# Patient Record
Sex: Female | Born: 1940 | Race: White | Hispanic: No | Marital: Married | Smoking: Never smoker
Health system: Southern US, Community
[De-identification: ages and names within clinical notes are randomized; demographics above are authoritative.]

## PROBLEM LIST (undated history)

## (undated) DIAGNOSIS — G4700 Insomnia, unspecified: Secondary | ICD-10-CM

## (undated) DIAGNOSIS — I10 Essential (primary) hypertension: Secondary | ICD-10-CM

## (undated) DIAGNOSIS — G9340 Encephalopathy, unspecified: Secondary | ICD-10-CM

## (undated) DIAGNOSIS — R339 Retention of urine, unspecified: Secondary | ICD-10-CM

## (undated) HISTORY — PX: ANKLE SURGERY: SHX546

## (undated) HISTORY — PX: CHOLECYSTECTOMY: SHX55

---

## 2001-05-25 ENCOUNTER — Encounter: Payer: Self-pay | Admitting: Emergency Medicine

## 2001-05-25 ENCOUNTER — Emergency Department (HOSPITAL_COMMUNITY): Admission: EM | Admit: 2001-05-25 | Discharge: 2001-05-25 | Payer: Self-pay | Admitting: Emergency Medicine

## 2003-10-19 ENCOUNTER — Encounter: Admission: RE | Admit: 2003-10-19 | Discharge: 2003-10-19 | Payer: Self-pay | Admitting: Specialist

## 2005-07-10 ENCOUNTER — Encounter: Admission: RE | Admit: 2005-07-10 | Discharge: 2005-10-08 | Payer: Self-pay | Admitting: Specialist

## 2014-02-10 ENCOUNTER — Emergency Department (HOSPITAL_COMMUNITY): Payer: Medicare Other

## 2014-02-10 ENCOUNTER — Inpatient Hospital Stay (HOSPITAL_COMMUNITY)
Admission: EM | Admit: 2014-02-10 | Discharge: 2014-02-16 | DRG: 981 | Disposition: A | Discharge status: Skilled Nursing Facility | Payer: Medicare Other | Attending: Internal Medicine | Admitting: Internal Medicine

## 2014-02-10 ENCOUNTER — Encounter (HOSPITAL_COMMUNITY): Payer: Self-pay | Admitting: Emergency Medicine

## 2014-02-10 DIAGNOSIS — I6783 Posterior reversible encephalopathy syndrome: Secondary | ICD-10-CM | POA: Diagnosis present

## 2014-02-10 DIAGNOSIS — N390 Urinary tract infection, site not specified: Secondary | ICD-10-CM | POA: Diagnosis not present

## 2014-02-10 DIAGNOSIS — D72829 Elevated white blood cell count, unspecified: Secondary | ICD-10-CM

## 2014-02-10 DIAGNOSIS — M79605 Pain in left leg: Secondary | ICD-10-CM

## 2014-02-10 DIAGNOSIS — S72002A Fracture of unspecified part of neck of left femur, initial encounter for closed fracture: Secondary | ICD-10-CM | POA: Diagnosis present

## 2014-02-10 DIAGNOSIS — E876 Hypokalemia: Secondary | ICD-10-CM | POA: Diagnosis present

## 2014-02-10 DIAGNOSIS — Y92009 Unspecified place in unspecified non-institutional (private) residence as the place of occurrence of the external cause: Secondary | ICD-10-CM

## 2014-02-10 DIAGNOSIS — G9340 Encephalopathy, unspecified: Secondary | ICD-10-CM

## 2014-02-10 DIAGNOSIS — Z9049 Acquired absence of other specified parts of digestive tract: Secondary | ICD-10-CM | POA: Diagnosis present

## 2014-02-10 DIAGNOSIS — W19XXXA Unspecified fall, initial encounter: Secondary | ICD-10-CM | POA: Diagnosis present

## 2014-02-10 DIAGNOSIS — I10 Essential (primary) hypertension: Secondary | ICD-10-CM | POA: Diagnosis present

## 2014-02-10 DIAGNOSIS — M25552 Pain in left hip: Secondary | ICD-10-CM

## 2014-02-10 DIAGNOSIS — Z886 Allergy status to analgesic agent status: Secondary | ICD-10-CM

## 2014-02-10 DIAGNOSIS — M545 Low back pain, unspecified: Secondary | ICD-10-CM

## 2014-02-10 DIAGNOSIS — R4182 Altered mental status, unspecified: Secondary | ICD-10-CM | POA: Diagnosis not present

## 2014-02-10 DIAGNOSIS — Z885 Allergy status to narcotic agent status: Secondary | ICD-10-CM

## 2014-02-10 DIAGNOSIS — E860 Dehydration: Secondary | ICD-10-CM

## 2014-02-10 DIAGNOSIS — Z96642 Presence of left artificial hip joint: Secondary | ICD-10-CM

## 2014-02-10 DIAGNOSIS — E871 Hypo-osmolality and hyponatremia: Secondary | ICD-10-CM | POA: Diagnosis present

## 2014-02-10 DIAGNOSIS — S72012A Unspecified intracapsular fracture of left femur, initial encounter for closed fracture: Secondary | ICD-10-CM | POA: Diagnosis present

## 2014-02-10 LAB — CBC WITH DIFFERENTIAL/PLATELET
Basophils Absolute: 0 10*3/uL (ref 0.0–0.1)
Basophils Relative: 0 % (ref 0–1)
EOS ABS: 0 10*3/uL (ref 0.0–0.7)
EOS PCT: 0 % (ref 0–5)
HCT: 40.5 % (ref 36.0–46.0)
Hemoglobin: 13.4 g/dL (ref 12.0–15.0)
Lymphocytes Relative: 5 % — ABNORMAL LOW (ref 12–46)
Lymphs Abs: 0.8 10*3/uL (ref 0.7–4.0)
MCH: 30.9 pg (ref 26.0–34.0)
MCHC: 33.1 g/dL (ref 30.0–36.0)
MCV: 93.5 fL (ref 78.0–100.0)
MONOS PCT: 5 % (ref 3–12)
Monocytes Absolute: 0.7 10*3/uL (ref 0.1–1.0)
NEUTROS PCT: 90 % — AB (ref 43–77)
Neutro Abs: 13.6 10*3/uL — ABNORMAL HIGH (ref 1.7–7.7)
PLATELETS: 280 10*3/uL (ref 150–400)
RBC: 4.33 MIL/uL (ref 3.87–5.11)
RDW: 12.7 % (ref 11.5–15.5)
WBC: 15.1 10*3/uL — ABNORMAL HIGH (ref 4.0–10.5)

## 2014-02-10 LAB — COMPREHENSIVE METABOLIC PANEL
ALK PHOS: 84 U/L (ref 39–117)
ALT: 16 U/L (ref 0–35)
AST: 24 U/L (ref 0–37)
Albumin: 3.9 g/dL (ref 3.5–5.2)
Anion gap: 9 (ref 5–15)
BILIRUBIN TOTAL: 1.2 mg/dL (ref 0.3–1.2)
BUN: 22 mg/dL (ref 6–23)
CHLORIDE: 101 meq/L (ref 96–112)
CO2: 24 mmol/L (ref 19–32)
Calcium: 9.7 mg/dL (ref 8.4–10.5)
Creatinine, Ser: 0.83 mg/dL (ref 0.50–1.10)
GFR, EST AFRICAN AMERICAN: 79 mL/min — AB (ref >90)
GFR, EST NON AFRICAN AMERICAN: 68 mL/min — AB (ref >90)
GLUCOSE: 153 mg/dL — AB (ref 70–99)
POTASSIUM: 3.4 mmol/L — AB (ref 3.5–5.1)
Sodium: 134 mmol/L — ABNORMAL LOW (ref 135–145)
Total Protein: 6.7 g/dL (ref 6.0–8.3)

## 2014-02-10 LAB — URINALYSIS, ROUTINE W REFLEX MICROSCOPIC
Glucose, UA: NEGATIVE mg/dL
Ketones, ur: >80 mg/dL — AB
Nitrite: NEGATIVE
Protein, ur: 100 mg/dL — AB
Specific Gravity, Urine: 1.026 (ref 1.005–1.030)
Urobilinogen, UA: 1 mg/dL (ref 0.0–1.0)
pH: 6 (ref 5.0–8.0)

## 2014-02-10 LAB — URINE MICROSCOPIC-ADD ON

## 2014-02-10 LAB — TSH: TSH: 2.2 u[IU]/mL (ref 0.350–4.500)

## 2014-02-10 LAB — AMMONIA: Ammonia: 18 umol/L (ref 11–32)

## 2014-02-10 LAB — CK: CK TOTAL: 240 U/L — AB (ref 7–177)

## 2014-02-10 MED ORDER — DEXTROSE 5 % IV SOLN
1.0000 g | Freq: Once | INTRAVENOUS | Status: AC
  Administered 2014-02-10: 1 g via INTRAVENOUS
  Filled 2014-02-10: qty 10
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
## 2014-02-10 NOTE — ED Notes (Signed)
Patient transported back to radiology
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
## 2014-02-11 ENCOUNTER — Inpatient Hospital Stay (HOSPITAL_COMMUNITY): Payer: Medicare Other

## 2014-02-11 ENCOUNTER — Encounter (HOSPITAL_COMMUNITY)
Admission: EM | Disposition: A | Discharge status: Skilled Nursing Facility | Payer: Self-pay | Source: Home / Self Care | Attending: Internal Medicine

## 2014-02-11 ENCOUNTER — Encounter (HOSPITAL_COMMUNITY): Payer: Self-pay | Admitting: Certified Registered Nurse Anesthetist

## 2014-02-11 ENCOUNTER — Inpatient Hospital Stay (HOSPITAL_COMMUNITY): Payer: Medicare Other | Admitting: Certified Registered Nurse Anesthetist

## 2014-02-11 DIAGNOSIS — E871 Hypo-osmolality and hyponatremia: Secondary | ICD-10-CM | POA: Diagnosis present

## 2014-02-11 DIAGNOSIS — R609 Edema, unspecified: Secondary | ICD-10-CM

## 2014-02-11 DIAGNOSIS — G9340 Encephalopathy, unspecified: Secondary | ICD-10-CM

## 2014-02-11 DIAGNOSIS — M25552 Pain in left hip: Secondary | ICD-10-CM

## 2014-02-11 DIAGNOSIS — R4182 Altered mental status, unspecified: Secondary | ICD-10-CM | POA: Diagnosis present

## 2014-02-11 DIAGNOSIS — I6783 Posterior reversible encephalopathy syndrome: Secondary | ICD-10-CM | POA: Diagnosis present

## 2014-02-11 DIAGNOSIS — Z886 Allergy status to analgesic agent status: Secondary | ICD-10-CM | POA: Diagnosis not present

## 2014-02-11 DIAGNOSIS — S72012A Unspecified intracapsular fracture of left femur, initial encounter for closed fracture: Secondary | ICD-10-CM | POA: Diagnosis present

## 2014-02-11 DIAGNOSIS — I10 Essential (primary) hypertension: Secondary | ICD-10-CM | POA: Diagnosis not present

## 2014-02-11 DIAGNOSIS — D72829 Elevated white blood cell count, unspecified: Secondary | ICD-10-CM

## 2014-02-11 DIAGNOSIS — Z9049 Acquired absence of other specified parts of digestive tract: Secondary | ICD-10-CM | POA: Diagnosis not present

## 2014-02-11 DIAGNOSIS — Y92009 Unspecified place in unspecified non-institutional (private) residence as the place of occurrence of the external cause: Secondary | ICD-10-CM | POA: Diagnosis not present

## 2014-02-11 DIAGNOSIS — N390 Urinary tract infection, site not specified: Secondary | ICD-10-CM | POA: Diagnosis present

## 2014-02-11 DIAGNOSIS — E876 Hypokalemia: Secondary | ICD-10-CM | POA: Diagnosis not present

## 2014-02-11 DIAGNOSIS — Z885 Allergy status to narcotic agent status: Secondary | ICD-10-CM | POA: Diagnosis not present

## 2014-02-11 DIAGNOSIS — E860 Dehydration: Secondary | ICD-10-CM | POA: Diagnosis not present

## 2014-02-11 DIAGNOSIS — W19XXXA Unspecified fall, initial encounter: Secondary | ICD-10-CM | POA: Diagnosis not present

## 2014-02-11 HISTORY — PX: HIP ARTHROPLASTY: SHX981

## 2014-02-11 LAB — RAPID URINE DRUG SCREEN, HOSP PERFORMED
AMPHETAMINES: NOT DETECTED
BARBITURATES: NOT DETECTED
BENZODIAZEPINES: NOT DETECTED
Cocaine: NOT DETECTED
OPIATES: NOT DETECTED
TETRAHYDROCANNABINOL: NOT DETECTED

## 2014-02-11 LAB — CBC WITH DIFFERENTIAL/PLATELET
BASOS PCT: 0 % (ref 0–1)
Basophils Absolute: 0 10*3/uL (ref 0.0–0.1)
EOS ABS: 0 10*3/uL (ref 0.0–0.7)
Eosinophils Relative: 0 % (ref 0–5)
HEMATOCRIT: 34.2 % — AB (ref 36.0–46.0)
Hemoglobin: 11.4 g/dL — ABNORMAL LOW (ref 12.0–15.0)
LYMPHS PCT: 8 % — AB (ref 12–46)
Lymphs Abs: 1.1 10*3/uL (ref 0.7–4.0)
MCH: 30.3 pg (ref 26.0–34.0)
MCHC: 33.3 g/dL (ref 30.0–36.0)
MCV: 91 fL (ref 78.0–100.0)
MONO ABS: 1.1 10*3/uL — AB (ref 0.1–1.0)
Monocytes Relative: 7 % (ref 3–12)
Neutro Abs: 12.4 10*3/uL — ABNORMAL HIGH (ref 1.7–7.7)
Neutrophils Relative %: 85 % — ABNORMAL HIGH (ref 43–77)
Platelets: 292 10*3/uL (ref 150–400)
RBC: 3.76 MIL/uL — ABNORMAL LOW (ref 3.87–5.11)
RDW: 12.8 % (ref 11.5–15.5)
WBC: 14.6 10*3/uL — AB (ref 4.0–10.5)

## 2014-02-11 LAB — CBC
HEMATOCRIT: 34.2 % — AB (ref 36.0–46.0)
HEMOGLOBIN: 11.3 g/dL — AB (ref 12.0–15.0)
MCH: 29.9 pg (ref 26.0–34.0)
MCHC: 33 g/dL (ref 30.0–36.0)
MCV: 90.5 fL (ref 78.0–100.0)
Platelets: 299 10*3/uL (ref 150–400)
RBC: 3.78 MIL/uL — AB (ref 3.87–5.11)
RDW: 12.8 % (ref 11.5–15.5)
WBC: 14.8 10*3/uL — AB (ref 4.0–10.5)

## 2014-02-11 LAB — COMPREHENSIVE METABOLIC PANEL
ALBUMIN: 3.6 g/dL (ref 3.5–5.2)
ALK PHOS: 79 U/L (ref 39–117)
ALT: 14 U/L (ref 0–35)
AST: 21 U/L (ref 0–37)
Anion gap: 14 (ref 5–15)
BILIRUBIN TOTAL: 1 mg/dL (ref 0.3–1.2)
BUN: 20 mg/dL (ref 6–23)
CHLORIDE: 100 meq/L (ref 96–112)
CO2: 18 mmol/L — ABNORMAL LOW (ref 19–32)
Calcium: 9.2 mg/dL (ref 8.4–10.5)
Creatinine, Ser: 0.69 mg/dL (ref 0.50–1.10)
GFR calc Af Amer: >90 mL/min (ref >90)
GFR, EST NON AFRICAN AMERICAN: 84 mL/min — AB (ref >90)
Glucose, Bld: 189 mg/dL — ABNORMAL HIGH (ref 70–99)
POTASSIUM: 3.2 mmol/L — AB (ref 3.5–5.1)
Sodium: 132 mmol/L — ABNORMAL LOW (ref 135–145)
Total Protein: 6.2 g/dL (ref 6.0–8.3)

## 2014-02-11 LAB — CREATININE, SERUM
Creatinine, Ser: 0.68 mg/dL (ref 0.50–1.10)
GFR, EST NON AFRICAN AMERICAN: 85 mL/min — AB (ref >90)

## 2014-02-11 LAB — SURGICAL PCR SCREEN
MRSA, PCR: NEGATIVE
STAPHYLOCOCCUS AUREUS: POSITIVE — AB

## 2014-02-11 LAB — MAGNESIUM: Magnesium: 1.8 mg/dL (ref 1.5–2.5)

## 2014-02-11 LAB — MRSA PCR SCREENING: MRSA by PCR: NEGATIVE

## 2014-02-11 SURGERY — HEMIARTHROPLASTY, HIP, DIRECT ANTERIOR APPROACH, FOR FRACTURE
Anesthesia: Monitor Anesthesia Care | Site: Hip | Laterality: Left

## 2014-02-11 MED ORDER — CEFAZOLIN SODIUM-DEXTROSE 2-3 GM-% IV SOLR
INTRAVENOUS | Status: DC | PRN
  Administered 2014-02-11: 2 g via INTRAVENOUS

## 2014-02-11 MED ORDER — DOCUSATE SODIUM 100 MG PO CAPS
100.0000 mg | ORAL_CAPSULE | Freq: Two times a day (BID) | ORAL | Status: DC
  Administered 2014-02-11 – 2014-02-16 (×8): 100 mg via ORAL
  Filled 2014-02-11 (×11): qty 1

## 2014-02-11 MED ORDER — SODIUM CHLORIDE 0.9 % IJ SOLN
INTRAMUSCULAR | Status: AC
  Filled 2014-02-11: qty 10

## 2014-02-11 MED ORDER — INFLUENZA VAC SPLIT QUAD 0.5 ML IM SUSY
0.5000 mL | PREFILLED_SYRINGE | INTRAMUSCULAR | Status: AC
  Administered 2014-02-12: 0.5 mL via INTRAMUSCULAR
  Filled 2014-02-11: qty 0.5

## 2014-02-11 MED ORDER — ONDANSETRON HCL 4 MG/2ML IJ SOLN: 4.0000 mg | Freq: Once | INTRAMUSCULAR | Status: DC | PRN

## 2014-02-11 MED ORDER — PROPOFOL 10 MG/ML IV EMUL
INTRAVENOUS | Status: AC
  Filled 2014-02-11: qty 300

## 2014-02-11 MED ORDER — LIDOCAINE HCL (CARDIAC) 20 MG/ML IV SOLN
INTRAVENOUS | Status: AC
  Filled 2014-02-11: qty 5

## 2014-02-11 MED ORDER — SODIUM CHLORIDE 0.9 % IV SOLN
INTRAVENOUS | Status: DC
  Administered 2014-02-11: 21:00:00 via INTRAVENOUS
  Filled 2014-02-11 (×8): qty 1000

## 2014-02-11 MED ORDER — MIDAZOLAM HCL 2 MG/2ML IJ SOLN
INTRAMUSCULAR | Status: AC
  Filled 2014-02-11: qty 2

## 2014-02-11 MED ORDER — LABETALOL HCL 5 MG/ML IV SOLN
10.0000 mg | INTRAVENOUS | Status: DC | PRN
  Administered 2014-02-11 (×2): 10 mg via INTRAVENOUS
  Filled 2014-02-11 (×2): qty 4

## 2014-02-11 MED ORDER — CEFAZOLIN SODIUM 1-5 GM-% IV SOLN
1.0000 g | Freq: Four times a day (QID) | INTRAVENOUS | Status: DC
Start: 2014-02-11 — End: 2014-02-11

## 2014-02-11 MED ORDER — EPHEDRINE SULFATE 50 MG/ML IJ SOLN
INTRAMUSCULAR | Status: AC
  Filled 2014-02-11: qty 1

## 2014-02-11 MED ORDER — FENTANYL CITRATE 0.05 MG/ML IJ SOLN: 50.0000 ug | INTRAMUSCULAR | Status: DC | PRN

## 2014-02-11 MED ORDER — ALUM & MAG HYDROXIDE-SIMETH 200-200-20 MG/5ML PO SUSP: 30.0000 mL | ORAL | Status: DC | PRN

## 2014-02-11 MED ORDER — ARTIFICIAL TEARS OP OINT
TOPICAL_OINTMENT | OPHTHALMIC | Status: AC
  Filled 2014-02-11: qty 3.5

## 2014-02-11 MED ORDER — METOCLOPRAMIDE HCL 5 MG/ML IJ SOLN
5.0000 mg | Freq: Three times a day (TID) | INTRAMUSCULAR | Status: DC | PRN
  Filled 2014-02-11: qty 2

## 2014-02-11 MED ORDER — HYDRALAZINE HCL 20 MG/ML IJ SOLN: 10.0000 mg | Freq: Four times a day (QID) | INTRAMUSCULAR | Status: DC | PRN

## 2014-02-11 MED ORDER — ASPIRIN EC 325 MG PO TBEC
325.0000 mg | DELAYED_RELEASE_TABLET | Freq: Two times a day (BID) | ORAL | Status: DC
  Administered 2014-02-11 – 2014-02-16 (×10): 325 mg via ORAL
  Filled 2014-02-11 (×11): qty 1

## 2014-02-11 MED ORDER — LABETALOL HCL 5 MG/ML IV SOLN
5.0000 mg | INTRAVENOUS | Status: DC | PRN
  Administered 2014-02-11: 5 mg via INTRAVENOUS
  Filled 2014-02-11: qty 4

## 2014-02-11 MED ORDER — SODIUM CHLORIDE 0.9 % IR SOLN
Status: DC | PRN
  Administered 2014-02-11: 1000 mL

## 2014-02-11 MED ORDER — HYDROCODONE-ACETAMINOPHEN 5-325 MG PO TABS: 1.0000 | ORAL_TABLET | Freq: Four times a day (QID) | ORAL | Status: DC | PRN

## 2014-02-11 MED ORDER — PNEUMOCOCCAL VAC POLYVALENT 25 MCG/0.5ML IJ INJ
0.5000 mL | INJECTION | INTRAMUSCULAR | Status: AC
  Administered 2014-02-12: 0.5 mL via INTRAMUSCULAR
  Filled 2014-02-11: qty 0.5

## 2014-02-11 MED ORDER — POLYETHYLENE GLYCOL 3350 17 G PO PACK
17.0000 g | PACK | Freq: Every day | ORAL | Status: DC | PRN
  Administered 2014-02-15 – 2014-02-16 (×2): 17 g via ORAL
  Filled 2014-02-11 (×4): qty 1

## 2014-02-11 MED ORDER — ONDANSETRON HCL 4 MG/2ML IJ SOLN
INTRAMUSCULAR | Status: AC
  Filled 2014-02-11: qty 2

## 2014-02-11 MED ORDER — MIDAZOLAM HCL 2 MG/2ML IJ SOLN: 1.0000 mg | INTRAMUSCULAR | Status: DC | PRN

## 2014-02-11 MED ORDER — POTASSIUM CHLORIDE CRYS ER 20 MEQ PO TBCR
40.0000 meq | EXTENDED_RELEASE_TABLET | Freq: Once | ORAL | Status: AC
  Administered 2014-02-11: 40 meq via ORAL
  Filled 2014-02-11: qty 2

## 2014-02-11 MED ORDER — SUCCINYLCHOLINE CHLORIDE 20 MG/ML IJ SOLN
INTRAMUSCULAR | Status: AC
  Filled 2014-02-11: qty 1

## 2014-02-11 MED ORDER — FENTANYL CITRATE 0.05 MG/ML IJ SOLN: 25.0000 ug | INTRAMUSCULAR | Status: DC | PRN

## 2014-02-11 MED ORDER — PROPOFOL INFUSION 10 MG/ML OPTIME
INTRAVENOUS | Status: DC | PRN
  Administered 2014-02-11: 100 ug/kg/min via INTRAVENOUS

## 2014-02-11 MED ORDER — METHOCARBAMOL 1000 MG/10ML IJ SOLN
500.0000 mg | Freq: Four times a day (QID) | INTRAVENOUS | Status: DC | PRN
  Filled 2014-02-11: qty 5

## 2014-02-11 MED ORDER — CARVEDILOL 6.25 MG PO TABS
6.2500 mg | ORAL_TABLET | Freq: Two times a day (BID) | ORAL | Status: DC
  Administered 2014-02-11 – 2014-02-14 (×6): 6.25 mg via ORAL
  Filled 2014-02-11 (×9): qty 1

## 2014-02-11 MED ORDER — AMLODIPINE BESYLATE 10 MG PO TABS
10.0000 mg | ORAL_TABLET | Freq: Every day | ORAL | Status: DC
  Administered 2014-02-12 – 2014-02-16 (×5): 10 mg via ORAL
  Filled 2014-02-11 (×5): qty 1

## 2014-02-11 MED ORDER — ROCURONIUM BROMIDE 50 MG/5ML IV SOLN
INTRAVENOUS | Status: AC
  Filled 2014-02-11: qty 1

## 2014-02-11 MED ORDER — MORPHINE SULFATE 2 MG/ML IJ SOLN
0.5000 mg | INTRAMUSCULAR | Status: DC | PRN
  Filled 2014-02-11: qty 1

## 2014-02-11 MED ORDER — FENTANYL CITRATE 0.05 MG/ML IJ SOLN
INTRAMUSCULAR | Status: AC
  Filled 2014-02-11: qty 5

## 2014-02-11 MED ORDER — POTASSIUM CHLORIDE IN NACL 20-0.9 MEQ/L-% IV SOLN
INTRAVENOUS | Status: DC
  Administered 2014-02-11: 04:00:00 via INTRAVENOUS
  Filled 2014-02-11 (×2): qty 1000

## 2014-02-11 MED ORDER — BUPIVACAINE-EPINEPHRINE (PF) 0.5% -1:200000 IJ SOLN
INTRAMUSCULAR | Status: DC | PRN
  Administered 2014-02-11: 30 mL via PERINEURAL

## 2014-02-11 MED ORDER — HEPARIN SODIUM (PORCINE) 5000 UNIT/ML IJ SOLN: 5000.0000 [IU] | Freq: Three times a day (TID) | INTRAMUSCULAR | Status: DC

## 2014-02-11 MED ORDER — PHENOL 1.4 % MT LIQD: 1.0000 | OROMUCOSAL | Status: DC | PRN

## 2014-02-11 MED ORDER — PHENYLEPHRINE HCL 10 MG/ML IJ SOLN
10.0000 mg | INTRAVENOUS | Status: DC | PRN
  Administered 2014-02-11: 80 ug/min via INTRAVENOUS

## 2014-02-11 MED ORDER — AMLODIPINE BESYLATE 2.5 MG PO TABS
2.5000 mg | ORAL_TABLET | Freq: Every day | ORAL | Status: DC
  Administered 2014-02-11: 2.5 mg via ORAL
  Filled 2014-02-11: qty 1

## 2014-02-11 MED ORDER — KCL IN DEXTROSE-NACL 40-5-0.9 MEQ/L-%-% IV SOLN
INTRAVENOUS | Status: DC
  Administered 2014-02-11: 08:00:00 via INTRAVENOUS
  Filled 2014-02-11 (×2): qty 1000

## 2014-02-11 MED ORDER — LACTATED RINGERS IV SOLN
INTRAVENOUS | Status: DC | PRN
  Administered 2014-02-11 (×2): via INTRAVENOUS

## 2014-02-11 MED ORDER — ACETAMINOPHEN 325 MG PO TABS
650.0000 mg | ORAL_TABLET | Freq: Four times a day (QID) | ORAL | Status: DC | PRN
  Administered 2014-02-11 – 2014-02-13 (×2): 650 mg via ORAL
  Filled 2014-02-11 (×2): qty 2

## 2014-02-11 MED ORDER — ONDANSETRON HCL 4 MG PO TABS: 4.0000 mg | ORAL_TABLET | Freq: Four times a day (QID) | ORAL | Status: DC | PRN

## 2014-02-11 MED ORDER — LABETALOL HCL 5 MG/ML IV SOLN
10.0000 mg | Freq: Once | INTRAVENOUS | Status: AC
  Administered 2014-02-11: 10 mg via INTRAVENOUS
  Filled 2014-02-11: qty 4

## 2014-02-11 MED ORDER — PHENYLEPHRINE HCL 10 MG/ML IJ SOLN
INTRAMUSCULAR | Status: DC | PRN
  Administered 2014-02-11: 40 ug via INTRAVENOUS

## 2014-02-11 MED ORDER — ASPIRIN EC 325 MG PO TBEC
325.0000 mg | DELAYED_RELEASE_TABLET | Freq: Two times a day (BID) | ORAL | Status: DC
Start: 2014-02-11 — End: 2017-09-26

## 2014-02-11 MED ORDER — METHOCARBAMOL 500 MG PO TABS
500.0000 mg | ORAL_TABLET | Freq: Four times a day (QID) | ORAL | Status: DC | PRN
  Filled 2014-02-11: qty 1

## 2014-02-11 MED ORDER — PROPOFOL 10 MG/ML IV BOLUS
INTRAVENOUS | Status: AC
  Filled 2014-02-11: qty 20

## 2014-02-11 MED ORDER — PROPOFOL 10 MG/ML IV BOLUS
INTRAVENOUS | Status: DC | PRN
  Administered 2014-02-11: 40 mg via INTRAVENOUS

## 2014-02-11 MED ORDER — AMLODIPINE BESYLATE 5 MG PO TABS
7.5000 mg | ORAL_TABLET | Freq: Once | ORAL | Status: AC
  Administered 2014-02-11: 7.5 mg via ORAL
  Filled 2014-02-11: qty 1

## 2014-02-11 MED ORDER — MENTHOL 3 MG MT LOZG: 1.0000 | LOZENGE | OROMUCOSAL | Status: DC | PRN

## 2014-02-11 MED ORDER — LACTATED RINGERS IV SOLN
INTRAVENOUS | Status: DC
  Administered 2014-02-11: 15:00:00 via INTRAVENOUS

## 2014-02-11 MED ORDER — FENTANYL CITRATE 0.05 MG/ML IJ SOLN
INTRAMUSCULAR | Status: DC | PRN
  Administered 2014-02-11 (×3): 25 ug via INTRAVENOUS
  Administered 2014-02-11: 100 ug via INTRAVENOUS

## 2014-02-11 MED ORDER — HYDROCODONE-ACETAMINOPHEN 5-325 MG PO TABS
1.0000 | ORAL_TABLET | Freq: Four times a day (QID) | ORAL | Status: DC | PRN
  Administered 2014-02-11 – 2014-02-14 (×2): 2 via ORAL
  Filled 2014-02-11 (×2): qty 2

## 2014-02-11 MED ORDER — CEFTRIAXONE SODIUM IN DEXTROSE 20 MG/ML IV SOLN
1.0000 g | INTRAVENOUS | Status: DC
  Administered 2014-02-11 – 2014-02-14 (×4): 1 g via INTRAVENOUS
  Filled 2014-02-11 (×5): qty 50

## 2014-02-11 MED ORDER — METOCLOPRAMIDE HCL 5 MG PO TABS
5.0000 mg | ORAL_TABLET | Freq: Three times a day (TID) | ORAL | Status: DC | PRN
  Filled 2014-02-11: qty 2

## 2014-02-11 MED ORDER — ONDANSETRON HCL 4 MG/2ML IJ SOLN
4.0000 mg | Freq: Four times a day (QID) | INTRAMUSCULAR | Status: DC | PRN
  Administered 2014-02-16: 4 mg via INTRAVENOUS
  Filled 2014-02-11: qty 2

## 2014-02-11 MED ORDER — ACETAMINOPHEN 650 MG RE SUPP: 650.0000 mg | Freq: Four times a day (QID) | RECTAL | Status: DC | PRN

## 2014-02-11 SURGICAL SUPPLY — 53 items
ADH SKN CLS APL DERMABOND .7 (GAUZE/BANDAGES/DRESSINGS) ×1
BLADE SAW SGTL 18X1.27X75 (BLADE) ×2 IMPLANT
BLADE SAW SGTL 18X1.27X75MM (BLADE) ×1
CAPT HIP HEMI 2 ×2 IMPLANT
COVER SURGICAL LIGHT HANDLE (MISCELLANEOUS) ×3 IMPLANT
DERMABOND ADVANCED (GAUZE/BANDAGES/DRESSINGS) ×2
DERMABOND ADVANCED .7 DNX12 (GAUZE/BANDAGES/DRESSINGS) ×1 IMPLANT
DRAPE IMP U-DRAPE 54X76 (DRAPES) ×3 IMPLANT
DRAPE INCISE IOBAN 85X60 (DRAPES) ×3 IMPLANT
DRAPE ORTHO SPLIT 77X108 STRL (DRAPES) ×6
DRAPE SURG ORHT 6 SPLT 77X108 (DRAPES) ×2 IMPLANT
DRAPE U-SHAPE 47X51 STRL (DRAPES) ×3 IMPLANT
DRSG AQUACEL AG ADV 3.5X10 (GAUZE/BANDAGES/DRESSINGS) ×3 IMPLANT
DURAPREP 26ML APPLICATOR (WOUND CARE) ×3 IMPLANT
ELECT BLADE 4.0 EZ CLEAN MEGAD (MISCELLANEOUS) ×3
ELECT REM PT RETURN 9FT ADLT (ELECTROSURGICAL) ×3
ELECTRODE BLDE 4.0 EZ CLN MEGD (MISCELLANEOUS) ×1 IMPLANT
ELECTRODE REM PT RTRN 9FT ADLT (ELECTROSURGICAL) ×1 IMPLANT
EVACUATOR 1/8 PVC DRAIN (DRAIN) IMPLANT
FACESHIELD WRAPAROUND (MASK) ×6 IMPLANT
FACESHIELD WRAPAROUND OR TEAM (MASK) ×2 IMPLANT
GLOVE BIOGEL PI IND STRL 7.5 (GLOVE) ×1 IMPLANT
GLOVE BIOGEL PI IND STRL 8 (GLOVE) ×2 IMPLANT
GLOVE BIOGEL PI INDICATOR 7.5 (GLOVE) ×2
GLOVE BIOGEL PI INDICATOR 8 (GLOVE) ×4
GLOVE ECLIPSE 8.0 STRL XLNG CF (GLOVE) ×3 IMPLANT
GLOVE ORTHO TXT STRL SZ7.5 (GLOVE) ×3 IMPLANT
GLOVE SURG ORTHO 8.0 STRL STRW (GLOVE) ×3 IMPLANT
GOWN STRL REIN 3XL XLG LVL4 (GOWN DISPOSABLE) ×3 IMPLANT
GOWN STRL REUS W/ TWL LRG LVL3 (GOWN DISPOSABLE) ×3 IMPLANT
GOWN STRL REUS W/TWL LRG LVL3 (GOWN DISPOSABLE) ×9
HANDPIECE INTERPULSE COAX TIP (DISPOSABLE)
IMMOBILIZER KNEE 22 UNIV (SOFTGOODS) ×3 IMPLANT
KIT BASIN OR (CUSTOM PROCEDURE TRAY) ×3 IMPLANT
KIT ROOM TURNOVER OR (KITS) ×3 IMPLANT
LIQUID BAND (GAUZE/BANDAGES/DRESSINGS) ×2 IMPLANT
MANIFOLD NEPTUNE II (INSTRUMENTS) ×3 IMPLANT
NS IRRIG 1000ML POUR BTL (IV SOLUTION) ×3 IMPLANT
PACK TOTAL JOINT (CUSTOM PROCEDURE TRAY) ×3 IMPLANT
PACK UNIVERSAL I (CUSTOM PROCEDURE TRAY) ×3 IMPLANT
PAD ARMBOARD 7.5X6 YLW CONV (MISCELLANEOUS) ×6 IMPLANT
SET HNDPC FAN SPRY TIP SCT (DISPOSABLE) IMPLANT
SPONGE LAP 4X18 X RAY DECT (DISPOSABLE) ×6 IMPLANT
SUT MNCRL AB 4-0 PS2 18 (SUTURE) ×2 IMPLANT
SUT VIC AB 1 CT1 27 (SUTURE) ×6
SUT VIC AB 1 CT1 27XBRD ANBCTR (SUTURE) ×2 IMPLANT
SUT VIC AB 2-0 CT1 27 (SUTURE) ×6
SUT VIC AB 2-0 CT1 TAPERPNT 27 (SUTURE) IMPLANT
TOWEL OR 17X24 6PK STRL BLUE (TOWEL DISPOSABLE) ×3 IMPLANT
TOWEL OR 17X26 10 PK STRL BLUE (TOWEL DISPOSABLE) ×3 IMPLANT
TRAY FOLEY CATH 14FR (SET/KITS/TRAYS/PACK) IMPLANT
V-LOC ×2 IMPLANT
WATER STERILE IRR 1000ML POUR (IV SOLUTION) ×4 IMPLANT

## 2014-02-11 NOTE — Progress Notes (Signed)
Foley placed for acute urinary retention

## 2014-02-11 NOTE — Progress Notes (Addendum)
Patient admitted earlier today for   1. 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 Monitor.   2. Left hip pain. X-ray reveals femoral fracture. Orthopedics consulted. Check baseline EKG. Denies any known heart issues, no loud murmurs. Should be low to moderate risk for adverse cardio pulmonary outcome. Coreg and Norvasc added for better blood pressure control.   3. Hypertension. Added Coreg and Norvasc. As needed IV hydralazine   4. UTI leukocytosis. On Rocephin follow cultures.   5. Low potassium. Replace.   6. Urinary retention. Over 700 mL. Place Foley.
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
## 2014-02-11 NOTE — Progress Notes (Signed)
VASCULAR LAB PRELIMINARY  PRELIMINARY  PRELIMINARY  PRELIMINARY  Bilateral lower extremity venous duplex  completed.    Preliminary report:  Bilateral:  No evidence of DVT, superficial thrombosis, or Baker's Cyst.    Jovian Lembcke, RVT 02/11/2014, 1:24 PM
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
## 2014-02-11 NOTE — Anesthesia Preprocedure Evaluation (Signed)
Anesthesia Evaluation  Patient identified by MRN, date of birth, ID band Patient awake    Reviewed: Allergy & Precautions, H&P , NPO status , Patient's Chart, lab work & pertinent test results  Airway Mallampati: I  TM Distance: >3 FB Neck ROM: Full    Dental  (+) Teeth Intact, Dental Advisory Given   Pulmonary  breath sounds clear to auscultation        Cardiovascular Rhythm:Regular Rate:Normal     Neuro/Psych    GI/Hepatic   Endo/Other    Renal/GU      Musculoskeletal   Abdominal   Peds  Hematology   Anesthesia Other Findings   Reproductive/Obstetrics                             Anesthesia Physical Anesthesia Plan  ASA: III  Anesthesia Plan: MAC and Spinal   Post-op Pain Management: MAC Combined w/ Regional for Post-op pain   Induction: Intravenous  Airway Management Planned: Simple Face Mask  Additional Equipment:   Intra-op Plan:   Post-operative Plan:   Informed Consent: I have reviewed the patients History and Physical, chart, labs and discussed the procedure including the risks, benefits and alternatives for the proposed anesthesia with the patient or authorized representative who has indicated his/her understanding and acceptance.   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 Spoke to pt daughter by phone and explained the anesthetic plan to her.  Will hope to do this without general anesthesia, using peripheral block and SAB.)        Anesthesia Quick Evaluation

## 2014-02-11 NOTE — Progress Notes (Signed)
EEG Completed; Results Pending  
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
## 2014-02-11 NOTE — Progress Notes (Signed)
0616 received results of MRI from radiologist. Txt paged Dr. Ernestina Patches with [facility] to notify of results. 0625 received call back from Dr. Ernestina Patches. Also notified MD of BP 170's-180's. Orders given for PRN 5 mg of labetalol and to administered scheduled dose of norvasc now.

## 2014-02-11 NOTE — Procedures (Signed)
ELECTROENCEPHALOGRAM REPORT  Patient: Monique Rush       Room #: 0Y17 EEG No. ID: [id number] Age: 73 y.o.      [phone]  Sex: female Referring Physician: Ronnie Derby Report Date:  02/11/2014        Interpreting Physician: Anthony Sar  History: Monique Rush is an 73 y.o. female admitted with encephalopathy and confusion with dehydration and hypertension, as well as PRES.  Indications for study:  Assess severity of encephalopathy; rule out focal seizure activity.  Technique: This is an 18 channel routine scalp EEG performed at the bedside with bipolar and monopolar montages arranged in accordance to the international 10/20 system of electrode placement.   Description: This EEG recording was performed during wakefulness. Patient was noted to be agitated and having visual hallucinations during the study. Considerable artifact secondary to muscle activity was recorded. Predominant cerebral activity consisted of 9 Hz symmetrical alpha rhythm. Photic stimulation and hyperventilation were not performed. No epileptiform discharges were recorded. There was no abnormal slowing of cerebral activity.  Interpretation: This is a normal EEG recording during wakefulness. No evidence of an epileptic disorder was demonstrated.   Rush Farmer M.D. [facility] [phone]

## 2014-02-11 NOTE — Progress Notes (Signed)
UR completed.  Dae Antonucci, RN BSN MHA CCM Trauma/Neuro ICU Case Manager [phone]  

## 2014-02-11 NOTE — Progress Notes (Signed)
Subjective: More awake.   She complains of "back" pain but it is really more left side pain.   Exam: Filed Vitals:   02/11/14 0804  BP: 151/76  Pulse: 95  Temp:   Resp: 21   Gen: In bed, NAD MS: awake, alert, oriented to person place month year.  EM:LJQGB, VFF, EOMI Motor: MAEW but limited by pain in the left knee.  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 She has some petechial hemorrhage on MRI, but this is minimal. Treatment is blood pressure control.   Recommendations: 1) BP goal normotension 2) would hold SQ heparin x 24 hours.  3) will continue to follow.   Roland Rack, MD [facility] [phone]  If 7pm- 7am, please page neurology on call as listed in [place].

## 2014-02-11 NOTE — Transfer of Care (Signed)
Immediate Anesthesia Transfer of Care Note  Patient: Monique Rush  Procedure(s) Performed: Procedure(s): ARTHROPLASTY BIPOLAR HIP (Left)  Patient Location: PACU  Anesthesia Type:Spinal  Level of Consciousness: awake and confused  Airway & Oxygen Therapy: Patient Spontanous Breathing and Patient connected to nasal cannula oxygen  Post-op Assessment: Report given to PACU RN and Post -op Vital signs reviewed and stable  Post vital signs: Reviewed and stable  Complications: No apparent anesthesia complications

## 2014-02-11 NOTE — ED Notes (Signed)
Neuro Hospitalist at bedside 

## 2014-02-11 NOTE — Anesthesia Postprocedure Evaluation (Signed)
  Anesthesia Post-op Note  Patient: Monique Rush  Procedure(s) Performed: Procedure(s): ARTHROPLASTY BIPOLAR HIP (Left)  Patient Location: PACU  Anesthesia Type:General  Level of Consciousness: awake and patient cooperative  Airway and Oxygen Therapy: Patient Spontanous Breathing and Patient connected to nasal cannula oxygen  Post-op Pain: none  Post-op Assessment: Post-op Vital signs reviewed, Patient's Cardiovascular Status Stable, Respiratory Function Stable, Patent Airway and Pain level controlled  Post-op Vital Signs: stable  Last Vitals:  Filed Vitals:   02/11/14 1939  BP:   Pulse: 75  Temp: 36.6 C  Resp: 14    Complications: No apparent anesthesia complications
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
## 2014-02-11 NOTE — Anesthesia Procedure Notes (Addendum)
Spinal Patient location during procedure: OR Start time: 02/11/2014 4:33 PM End time: 02/11/2014 4:37 PM Staffing Anesthesiologist: Mahlia Fernando A Performed by: anesthesiologist  Preanesthetic Checklist Completed: patient identified, site marked, surgical consent, pre-op evaluation, timeout performed, IV checked, risks and benefits discussed and monitors and equipment checked Spinal Block Patient position: left lateral decubitus Prep: Betadine Patient monitoring: cardiac monitor, blood pressure and continuous pulse ox Approach: midline Location: L3-4 Injection technique: single-shot Needle Needle type: Quincke  Needle gauge: 25 G Needle length: 9 cm Assessment Sensory level: T10 Additional Notes Clear CSF obtained with 1 attempt, 4 quadrants. No blood. 1.5 ml of Spinal Marcaine (0.75%) given.  Date/Time: 02/11/2014 4:00 PM Performed by: Izora Gala Pre-anesthesia Checklist: Patient identified, Emergency Drugs available, Suction available and Patient being monitored Patient Re-evaluated:Patient Re-evaluated prior to inductionOxygen Delivery Method: Nasal cannula Intubation Type: IV induction Placement Confirmation: positive ETCO2    Anesthesia Regional Block:  Fascia iliaca block  Pre-Anesthetic Checklist: ,, timeout performed, Correct Patient, Correct Site, Correct Laterality, Correct Procedure, Correct Position, site marked, Risks and benefits discussed,  Surgical consent,  Pre-op evaluation,  At surgeon's request and post-op pain management  Laterality: Left and Lower  Prep: chloraprep       Needles:  Injection technique: Single-shot  Needle Type: Echogenic Needle     Needle Length: 9cm 9 cm Needle Gauge: 21 and 21 G    Additional Needles:  Procedures: ultrasound guided (picture in chart) Fascia iliaca block Narrative:  Start time: 02/11/2014 3:55 PM End time: 02/11/2014 4:04 PM Injection made incrementally with aspirations every 5 mL.  Performed by:  Personally  Anesthesiologist: [place], [place]

## 2014-02-12 ENCOUNTER — Inpatient Hospital Stay (HOSPITAL_COMMUNITY): Payer: Medicare Other

## 2014-02-12 ENCOUNTER — Encounter (HOSPITAL_COMMUNITY): Payer: Self-pay | Admitting: Orthopedic Surgery

## 2014-02-12 DIAGNOSIS — D72829 Elevated white blood cell count, unspecified: Secondary | ICD-10-CM

## 2014-02-12 DIAGNOSIS — N390 Urinary tract infection, site not specified: Secondary | ICD-10-CM | POA: Diagnosis not present

## 2014-02-12 DIAGNOSIS — E860 Dehydration: Secondary | ICD-10-CM

## 2014-02-12 DIAGNOSIS — G9340 Encephalopathy, unspecified: Secondary | ICD-10-CM

## 2014-02-12 DIAGNOSIS — S72002A Fracture of unspecified part of neck of left femur, initial encounter for closed fracture: Secondary | ICD-10-CM | POA: Diagnosis present

## 2014-02-12 LAB — BASIC METABOLIC PANEL
Anion gap: 7 (ref 5–15)
BUN: 13 mg/dL (ref 6–23)
CHLORIDE: 106 meq/L (ref 96–112)
CO2: 24 mmol/L (ref 19–32)
CREATININE: 0.53 mg/dL (ref 0.50–1.10)
Calcium: 8.8 mg/dL (ref 8.4–10.5)
GFR calc Af Amer: >90 mL/min (ref >90)
GFR calc non Af Amer: >90 mL/min (ref >90)
GLUCOSE: 96 mg/dL (ref 70–99)
Potassium: 3.9 mmol/L (ref 3.5–5.1)
Sodium: 137 mmol/L (ref 135–145)

## 2014-02-12 LAB — URINE CULTURE
Colony Count: NO GROWTH
Culture: NO GROWTH

## 2014-02-12 LAB — CBC
HEMATOCRIT: 31.4 % — AB (ref 36.0–46.0)
HEMOGLOBIN: 10.2 g/dL — AB (ref 12.0–15.0)
MCH: 30.1 pg (ref 26.0–34.0)
MCHC: 32.5 g/dL (ref 30.0–36.0)
MCV: 92.6 fL (ref 78.0–100.0)
Platelets: 281 10*3/uL (ref 150–400)
RBC: 3.39 MIL/uL — ABNORMAL LOW (ref 3.87–5.11)
RDW: 12.8 % (ref 11.5–15.5)
WBC: 10.3 10*3/uL (ref 4.0–10.5)

## 2014-02-12 MED ORDER — TAMSULOSIN HCL 0.4 MG PO CAPS
0.4000 mg | ORAL_CAPSULE | Freq: Every day | ORAL | Status: DC
  Administered 2014-02-12 – 2014-02-16 (×5): 0.4 mg via ORAL
  Filled 2014-02-12 (×5): qty 1

## 2014-02-12 MED ORDER — DONEPEZIL HCL 5 MG PO TABS
5.0000 mg | ORAL_TABLET | Freq: Every day | ORAL | Status: DC
  Administered 2014-02-12 – 2014-02-15 (×4): 5 mg via ORAL
  Filled 2014-02-12 (×5): qty 1

## 2014-02-12 NOTE — Progress Notes (Signed)
Orthopedic Tech Progress Note Patient Details:  Monique Rush 1940-08-10 [id number] Short knee immobilizer applied as directed by MD in order. Application tolerated well.  Ortho Devices Type of Ortho Device: Knee Immobilizer Ortho Device/Splint Location: LLE Ortho Device/Splint Interventions: Application   Asia R Thompson 02/12/2014, 1:33 PM

## 2014-02-12 NOTE — Progress Notes (Signed)
Patient oxygen level dropping to 80-85% on room air while sleeping.Oxygen at 2 lnc placed oxygen levels back up 95-99%.Will continue to monitor.

## 2014-02-12 NOTE — Progress Notes (Signed)
02/12/2014 influenza vaccine given in left deltoid at 0953. Lot #: [id number] and expiration date 08/12/14. Ephrem Carrick Insurance claims handler.

## 2014-02-12 NOTE — Progress Notes (Signed)
Patient arrived via bed from unit 3 mw.Patient assisted to bed by nursing staff.Patient oriented to unit.Patient is alert to self at present time reoriented to hospital and and date.Side rails up times 2 and bed alarm on for safety.Will continue to monitor patient.

## 2014-02-12 NOTE — Progress Notes (Signed)
02/12/2014 pneumococcal vaccine given at 1008. Given in right deltoid. Lot #: [id number]. And expire October 01, 2015. Makennah Omura Insurance claims handler.

## 2014-02-12 NOTE — Progress Notes (Signed)
Rehab Admissions Coordinator Note:  Patient was screened by Saundra Gin L for appropriateness for an Inpatient Acute Rehab Consult.  At this time, we are recommending Inpatient Rehab consult.  Darianny Momon L 02/12/2014, 2:42 PM  I can be reached at [phone].
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
## 2014-02-12 NOTE — Clinical Social Work Note (Signed)
CSW attempted to call both contact numbers- patients son does not have voicemail set up, patients daughter's phone has been disconnected.  CSW will continue to follow.  Domenica Reamer, [place] Social Worker [phone]

## 2014-02-12 NOTE — Progress Notes (Signed)
Subjective: Went to OR yesterday for femoral fracture. Continues to confabulate.   Exam: Filed Vitals:   02/12/14 1148  BP: 122/77  Pulse: 94  Temp: 98.3 F (36.8 C)  Resp: 20   Gen: In bed, NAD MS: awake, alert, oriented to person place month year.  XB:OERQS, VFF, EOMI Motor: MAEW but limited by pain in the left knee.  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 She has some petechial hemorrhage on MRI, but this is minimal. Treatment is blood pressure control. 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   Recommendations: 1) BP goal normotension 2) can restart SQ heparin if desired 3) will continue to follow.  4) will likely need follow up MRI in a few weeks.   Roland Rack, MD [facility] [phone]  If 7pm- 7am, please page neurology on call as listed in [place].

## 2014-02-12 NOTE — Clinical Social Work Placement (Signed)
Clinical Social Work Department CLINICAL SOCIAL WORK PLACEMENT NOTE 02/12/2014  Patient:  Monique Rush, Monique Rush  Account Number:  [id number] Admit date:  02/10/2014  Clinical Social Worker:  Domenica Reamer, CLINICAL SOCIAL WORKER  Date/time:  02/12/2014 12:46 PM  Clinical Social Work is seeking post-discharge placement for this patient at the following level of care:   SKILLED NURSING   (*CSW will update this form in Epic as items are completed)   02/12/2014  Patient/family provided with [place] Department of Clinical Social Work's list of facilities offering this level of care within the geographic area requested by the patient (or if unable, by the patient's family).  02/12/2014  Patient/family informed of their freedom to choose among providers that offer the needed level of care, that participate in Medicare, Medicaid or managed care program needed by the patient, have an available bed and are willing to accept the patient.  02/12/2014  Patient/family informed of [facility]' ownership interest in [facility], as well as of the fact that they are under no obligation to receive care at this facility.  PASARR submitted to EDS on 02/12/2014 PASARR number received on 02/12/2014  FL2 transmitted to all facilities in geographic area requested by pt/family on  02/12/2014 FL2 transmitted to all facilities within larger geographic area on   Patient informed that his/her managed care company has contracts with or will negotiate with  certain facilities, including the following:     Patient/family informed of bed offers received:   Patient chooses bed at  Physician recommends and patient chooses bed at    Patient to be transferred to  on   Patient to be transferred to facility by  Patient and family notified of transfer on  Name of family member notified:    The following physician request were entered in Epic:   Additional Comments: Domenica Reamer, [place] Social  Worker [phone]

## 2014-02-12 NOTE — Progress Notes (Signed)
Patient refusing to go for Ct scan this morning.Patient stated,"I already had one ."Tried to explain to patient that this is repeat scan to see if there are any changes .Patient continues to refuse to go for scan.

## 2014-02-12 NOTE — Clinical Social Work Psychosocial (Signed)
Clinical Social Work Department BRIEF PSYCHOSOCIAL ASSESSMENT 02/12/2014  Patient:  Monique Rush, Monique Rush     Account Number:  [id number]     Admit date:  02/10/2014  Clinical Social Worker:  Domenica Reamer, [place]  Date/Time:  02/12/2014 12:44 PM  Referred by:  Physician  Date Referred:  02/12/2014 Referred for  SNF Placement   Other Referral:   Interview type:  Patient Other interview type:    PSYCHOSOCIAL DATA Living Status:  ALONE Admitted from facility:   Level of care:   Primary support name:  Butch Penny Primary support relationship to patient:  CHILD, ADULT Degree of support available:   high    CURRENT CONCERNS Current Concerns  Post-Acute Placement   Other Concerns:    SOCIAL WORK ASSESSMENT / PLAN CSW spoke with patinet concerning SNF placement- patient is agreeable to search in [employer]. CSW will continue to follow   Assessment/plan status:  Psychosocial Support/Ongoing Assessment of Needs Other assessment/ plan:   FL2   Information/referral to community resources:    PATIENT'S/FAMILY'S RESPONSE TO PLAN OF CARE: Patient is agreeable to rehab and is hopeful to leave hospital soon.       Domenica Reamer, [place] Social Worker [phone]
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
## 2014-02-13 DIAGNOSIS — I6783 Posterior reversible encephalopathy syndrome: Secondary | ICD-10-CM

## 2014-02-13 LAB — HEMOGLOBIN AND HEMATOCRIT, BLOOD
HCT: 31.4 % — ABNORMAL LOW (ref 36.0–46.0)
Hemoglobin: 10.3 g/dL — ABNORMAL LOW (ref 12.0–15.0)

## 2014-02-13 MED ORDER — LORAZEPAM 0.5 MG PO TABS
0.5000 mg | ORAL_TABLET | Freq: Once | ORAL | Status: AC
  Administered 2014-02-13: 0.5 mg via ORAL
  Filled 2014-02-13: qty 1

## 2014-02-13 MED ORDER — LORAZEPAM 2 MG/ML IJ SOLN
1.0000 mg | Freq: Once | INTRAMUSCULAR | Status: AC
  Administered 2014-02-13: 1 mg via INTRAVENOUS

## 2014-02-13 MED ORDER — LORAZEPAM 2 MG/ML IJ SOLN
INTRAMUSCULAR | Status: AC
  Filled 2014-02-13: qty 1

## 2014-02-13 MED ORDER — HYDRALAZINE HCL 50 MG PO TABS
50.0000 mg | ORAL_TABLET | Freq: Three times a day (TID) | ORAL | Status: DC
  Administered 2014-02-13 – 2014-02-15 (×6): 50 mg via ORAL
  Filled 2014-02-13 (×11): qty 1

## 2014-02-13 NOTE — Progress Notes (Signed)
Patient ID: Monique Rush, female   DOB: [birth year], 73 y.o.   MRN: [id number] Subjective: 1 Days Post-Op Procedure(s) (LRB): ARTHROPLASTY BIPOLAR HIP (Left)  Note from visit with patient and family 12/24  Patient reports pain as mild.  Sitting up in bed having eaten breakfast  Objective:   VITALS:   Filed Vitals:   02/13/14 1200  BP:   Pulse:   Temp:   Resp: 16    Neurovascular intact Incision: dressing C/D/I  LABS  Recent Labs  02/11/14 0215 02/11/14 0216 02/12/14 0305 02/13/14 0344  HGB 11.3* 11.4* 10.2* 10.3*  HCT 34.2* 34.2* 31.4* 31.4*  WBC 14.8* 14.6* 10.3  --   PLT 299 292 281  --      Recent Labs  02/10/14 2251 02/11/14 0215 02/11/14 0216 02/12/14 0305  NA 134*  --  132* 137  K 3.4*  --  3.2* 3.9  BUN 22  --  20 13  CREATININE 0.83 0.68 0.69 0.53  GLUCOSE 153*  --  189* 96    No results for input(s): LABPT, INR in the last 72 hours.   Assessment/Plan: 2 Days Post-Op Procedure(s) (LRB): ARTHROPLASTY BIPOLAR HIP (Left)   Advance diet Up with therapy Discharge to SNF at D/C  [facility]
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
## 2014-02-13 NOTE — Progress Notes (Signed)
Subjective: 2 Days Post-Op Procedure(s) (LRB): ARTHROPLASTY BIPOLAR HIP (Left) Patient reports pain as mild.  No c/o.  OOB with PT yesterday.  Objective: Vital signs in last 24 hours: Temp:  [98 F (36.7 C)-98.4 F (36.9 C)] 98.4 F (36.9 C) (12/25 0622) Pulse Rate:  [87-96] 96 (12/25 0622) Resp:  [16-20] 17 (12/25 0622) BP: (118-150)/(50-85) 150/66 mmHg (12/25 0622) SpO2:  [96 %-99 %] 97 % (12/25 0622) Weight:  [83.054 kg (183 lb 1.6 oz)-85.1 kg (187 lb 9.8 oz)] 83.054 kg (183 lb 1.6 oz) (12/25 0622)  Intake/Output from previous day: 12/24 0701 - 12/25 0700 In: 1380 [P.O.:780; I.V.:600] Out: 1175 [Urine:1175] Intake/Output this shift: Total I/O In: 50 [P.O.:50] Out: -    Recent Labs  02/10/14 2111 02/11/14 0215 02/11/14 0216 02/12/14 0305 02/13/14 0344  HGB 13.4 11.3* 11.4* 10.2* 10.3*    Recent Labs  02/11/14 0216 02/12/14 0305 02/13/14 0344  WBC 14.6* 10.3  --   RBC 3.76* 3.39*  --   HCT 34.2* 31.4* 31.4*  PLT 292 281  --     Recent Labs  02/11/14 0216 02/12/14 0305  NA 132* 137  K 3.2* 3.9  CL 100 106  CO2 18* 24  BUN 20 13  CREATININE 0.69 0.53  GLUCOSE 189* 96  CALCIUM 9.2 8.8   No results for input(s): LABPT, INR in the last 72 hours.  PE:  R LE wo9und dressed and dry.  NVI at RLE.  Assessment/Plan: 2 Days Post-Op Procedure(s) (LRB): ARTHROPLASTY BIPOLAR HIP (Left) Up with therapy  WBAT on R LE.  Monique Rush 02/13/2014, 9:20 AM

## 2014-02-13 NOTE — Progress Notes (Signed)
Subjective: Continued confusion.   Exam: Filed Vitals:   02/13/14 1428  BP: 142/62  Pulse: 102  Temp: 98.2 F (36.8 C)  Resp: 20   Gen: In bed, NAD MS: awake, alert, states that she thinks she is in a bus.  HE:NIDPO, VFF, EOMI Motor: MAEW but limited by pain in the left knee.  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 She has some petechial hemorrhage on MRI, but this is minimal. Treatment is blood pressure control. 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   Recommendations: 1) BP goal normotension 2) will likely need follow up MRI in a few weeks.   Roland Rack, MD [facility] [phone]  If 7pm- 7am, please page neurology on call as listed in [place].

## 2014-02-14 MED ORDER — CARVEDILOL 12.5 MG PO TABS
12.5000 mg | ORAL_TABLET | Freq: Two times a day (BID) | ORAL | Status: DC
  Administered 2014-02-14 – 2014-02-16 (×5): 12.5 mg via ORAL
  Filled 2014-02-14 (×6): qty 1

## 2014-02-14 MED ORDER — HYDROCODONE-ACETAMINOPHEN 5-325 MG PO TABS
1.0000 | ORAL_TABLET | Freq: Four times a day (QID) | ORAL | Status: DC | PRN
  Administered 2014-02-14 – 2014-02-16 (×2): 1 via ORAL
  Filled 2014-02-14 (×2): qty 1

## 2014-02-14 MED ORDER — CARVEDILOL 6.25 MG PO TABS
6.2500 mg | ORAL_TABLET | Freq: Once | ORAL | Status: AC
  Administered 2014-02-14: 6.25 mg via ORAL
  Filled 2014-02-14: qty 1

## 2014-02-14 MED ORDER — HYDROCODONE-ACETAMINOPHEN 5-325 MG PO TABS: 1.0000 | ORAL_TABLET | Freq: Four times a day (QID) | ORAL | Status: DC | PRN

## 2014-02-14 NOTE — Progress Notes (Signed)
Nutrition Brief Note  Patient identified on the Malnutrition Screening Tool (MST) Report. She reports that her usual weight is 160 lb after she lost a lot of weight, used to weight 180 lb. Current intake is adequate and it appears that patient has regained the weight she reports that she lost.  Wt Readings from Last 15 Encounters:  02/14/14 181 lb (82.101 kg)    Body mass index is 27.53 kg/(m^2). Patient meets criteria for overweight based on current BMI.   Current diet order is regular, patient is consuming approximately 75-100% of meals at this time. Labs and medications reviewed.   No nutrition interventions warranted at this time. If nutrition issues arise, please consult RD.   Molli Barrows, RD, LDN, [place] Pager [phone] After Hours Pager [phone]

## 2014-02-14 NOTE — Progress Notes (Signed)
Subjective: Continued confusion.   Exam: Filed Vitals:   02/14/14 0900  BP: 148/56  Pulse: 90  Temp:   Resp:    Gen: In bed, NAD MS: awake, alert, oriented to person, place, "few days before 2016" and able to appropriately tell me why she is in the hospital.  ID:[id number], VFF, EOMI Motor: MAEW but limited by pain in the left knee.  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 She has some petechial hemorrhage on MRI, but this is minimal. Treatment is blood pressure control. 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 She has had significant recovery already and feel that this will likely continue.   Recommendations: 1) BP goal normotension 2) Neurology will sign off, please call with any significant changes. Would follow up with neurology as an outpatient.   Roland Rack, MD [facility] [phone]  If 7pm- 7am, please page neurology on call as listed in [place].
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
## 2014-02-14 NOTE — Progress Notes (Signed)
The patient was able to have full, coherent conversations with the RN last night.  She did try to get out of bed a few times without assistence, but was easily reoriented.  The bed alarm was kept on.  She refused her SCDs overnight.

## 2014-02-14 NOTE — Progress Notes (Signed)
   Subjective: 3 Days Post-Op Procedure(s) (LRB): ARTHROPLASTY BIPOLAR HIP (Left) Patient reports pain as mild.   Patient seen in rounds with Dr. Tonita Cong. Patient is well, and has had no acute complaints or problems. She reports that she is doing well. Minimal pain. No issues overnight. No SOB or chest pain.  Plan is to go Skilled nursing facility after hospital stay.  Objective: Vital signs in last 24 hours: Temp:  [98.1 F (36.7 C)-98.6 F (37 C)] 98.1 F (36.7 C) (12/26 0607) Pulse Rate:  [85-102] 85 (12/26 0607) Resp:  [16-20] 18 (12/26 0607) BP: (142-160)/(58-70) 160/65 mmHg (12/26 0607) SpO2:  [96 %-98 %] 96 % (12/26 0607) Weight:  [82.101 kg (181 lb)] 82.101 kg (181 lb) (12/26 0607)  Intake/Output from previous day:  Intake/Output Summary (Last 24 hours) at 02/14/14 0846 Last data filed at 02/14/14 0751  Gross per 24 hour  Intake 1998.33 ml  Output   2275 ml  Net -276.67 ml    Intake/Output this shift: Total I/O In: 180 [P.O.:180] Out: 750 [Urine:750]  Labs:  Recent Labs  02/12/14 0305 02/13/14 0344  HGB 10.2* 10.3*    Recent Labs  02/12/14 0305 02/13/14 0344  WBC 10.3  --   RBC 3.39*  --   HCT 31.4* 31.4*  PLT 281  --     Recent Labs  02/12/14 0305  NA 137  K 3.9  CL 106  CO2 24  BUN 13  CREATININE 0.53  GLUCOSE 96  CALCIUM 8.8    EXAM General - Patient is Alert and Oriented Extremity - Neurologically intact Intact pulses distally Dorsiflexion/Plantar flexion intact Dressing/Incision - clean, dry, no drainage Motor Function - intact, moving foot and toes well on exam.   History reviewed. No pertinent past medical history.  Assessment/Plan: 3 Days Post-Op Procedure(s) (LRB): ARTHROPLASTY BIPOLAR HIP (Left) Principal Problem:   PRES (posterior reversible encephalopathy syndrome) Active Problems:   Encephalopathy   Leukocytosis   Dehydration   Fracture of femoral neck, left  Estimated body mass index is 27.53 kg/(m^2) as  calculated from the following:   Height as of this encounter: 5\' 8"  (1.727 m).   Weight as of this encounter: 82.101 kg (181 lb). Advance diet Up with therapy Discharge to SNF when medically stable  DVT Prophylaxis - Aspirin Weight-Bearing as tolerated   Patient doing well. Reports that she is ready to go home. Ready for DC from ortho standpoint.   Ardeen Jourdain, PA-C Orthopaedic Surgery 02/14/2014, 8:46 AM

## 2014-02-14 NOTE — Progress Notes (Signed)
The patient's nighttime dose of hydralazine was not given due to the orders and a BP of 113/20.

## 2014-02-15 MED ORDER — HYDRALAZINE HCL 25 MG PO TABS
25.0000 mg | ORAL_TABLET | Freq: Three times a day (TID) | ORAL | Status: DC
  Administered 2014-02-15 – 2014-02-16 (×3): 25 mg via ORAL
  Filled 2014-02-15 (×6): qty 1

## 2014-02-15 MED ORDER — LORAZEPAM 0.5 MG PO TABS
0.5000 mg | ORAL_TABLET | Freq: Once | ORAL | Status: AC
  Administered 2014-02-15: 0.5 mg via ORAL
  Filled 2014-02-15: qty 1
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
## 2014-02-16 DIAGNOSIS — I6783 Posterior reversible encephalopathy syndrome: Secondary | ICD-10-CM

## 2014-02-16 DIAGNOSIS — S72002S Fracture of unspecified part of neck of left femur, sequela: Secondary | ICD-10-CM

## 2014-02-16 MED ORDER — HYDROCODONE-ACETAMINOPHEN 5-325 MG PO TABS: 1.0000 | ORAL_TABLET | Freq: Four times a day (QID) | ORAL | Status: DC | PRN

## 2014-02-16 MED ORDER — AMLODIPINE BESYLATE 10 MG PO TABS: 10.0000 mg | ORAL_TABLET | Freq: Every day | ORAL | Status: DC

## 2014-02-16 MED ORDER — POLYETHYLENE GLYCOL 3350 17 G PO PACK: 17.0000 g | PACK | Freq: Every day | ORAL | Status: DC | PRN

## 2014-02-16 MED ORDER — CARVEDILOL 12.5 MG PO TABS: 12.5000 mg | ORAL_TABLET | Freq: Two times a day (BID) | ORAL | Status: DC

## 2014-02-16 MED ORDER — TAMSULOSIN HCL 0.4 MG PO CAPS: 0.4000 mg | ORAL_CAPSULE | Freq: Every day | ORAL | Status: DC

## 2014-02-16 MED ORDER — HYDRALAZINE HCL 25 MG PO TABS: 25.0000 mg | ORAL_TABLET | Freq: Three times a day (TID) | ORAL | Status: DC

## 2014-02-16 MED ORDER — DONEPEZIL HCL 5 MG PO TABS: 5.0000 mg | ORAL_TABLET | Freq: Every day | ORAL | Status: DC
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
## 2014-02-16 NOTE — Progress Notes (Signed)
Physical Therapy Treatment Patient Details Name: Monique Rush MRN: [id number] DOB: 11-23-1940 Today's Date: 02/16/2014    History of Present Illness 73 y.o. year old female presenting with PRES syndrome, encephalopathy, leukocystosis, dehydration. Left hip pain. X-ray reveals femoral fracture s/p fall with L hip hemi and WBAT.       PT Comments    Pt demonstrating improvement with functional mobility and will continue to benefit from PT services to address decreased strength, decreased balance, difficulty maintaining precautions during functional mobility, endurance, gait, and decreased safety awareness/memory impairments. Pt required overall min A with mobility today using RW. Per chart, plan is for pt to d/c to CIR to continue rehab. Recommend 24/7 supervision upon d/c due to memory impairments and history of falls.   Follow Up Recommendations  CIR;Supervision/Assistance - 24 hour     Equipment Recommendations  Rolling walker with 5" wheels    Recommendations for Other Services       Precautions / Restrictions Precautions Precautions: Posterior Hip;Fall Precaution Comments: Pt unable to recall any of the THA precautions. Educated pt again and will need reinforcement during mobility. Making a sign may be helpful Required Braces or Orthoses: Knee Immobilizer - Left Knee Immobilizer - Left: On at all times (to aid with prevention of dislocation/buckling) Restrictions Weight Bearing Restrictions: Yes LLE Weight Bearing: Weight bearing as tolerated    Mobility  Bed Mobility Overal bed mobility: Needs Assistance Bed Mobility: Supine to Sit;Sit to Supine     Supine to sit: Supervision Sit to supine: Min guard   General bed mobility comments: Overall supervision for bed mobility but requires cues for technique to maitnain precautions. Required slight min A of LLE when returning to supine in order to maintain precautions.  Transfers Overall transfer level: Needs  assistance Equipment used: Rolling walker (2 wheeled) Transfers: Sit to/from Stand Sit to Stand: Min assist Stand pivot transfers: Min assist       General transfer comment: Pt requires cues for technique in order to maintain L hip precautions including hand placement when using RW for safety.  Ambulation/Gait Ambulation/Gait assistance: Min assist Ambulation Distance (Feet): 13 Feet (x2) Assistive device: Rolling walker (2 wheeled) Gait Pattern/deviations: Step-to pattern;Decreased stance time - left;Antalgic Gait velocity: decreased   General Gait Details: Cues to maintain correct positioning of RW and during turns requires cues for technique to maintain hip precautions   Stairs            Wheelchair Mobility    Modified Rankin (Stroke Patients Only)       Balance Overall balance assessment: Needs assistance Sitting-balance support: No upper extremity supported;Feet supported Sitting balance-Leahy Scale: Good Sitting balance - Comments: S for functional tasks seated EOB   Standing balance support: Bilateral upper extremity supported;During functional activity Standing balance-Leahy Scale: Fair                      Cognition Arousal/Alertness: Awake/alert Behavior During Therapy: WFL for tasks assessed/performed Overall Cognitive Status: No family/caregiver present to determine baseline cognitive functioning Area of Impairment: Memory     Memory: Decreased recall of precautions;Decreased short-term memory         General Comments: Pt able to recall her medical history but often gets off topic and distracts easily and needs cues to attend to task at hand. pt with decreased memory in regards to any precautions or limitations in ROM due to surgery.     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 Per chart, KI is used to aid with maintainence of hip precautions. Unclear how much this is actually helping due to premorbid deformity in knee. KI does not provide enough support to correct for this deformity. Pt reports she has had this "for awhile" since a previous knee injury. No buckling noted with mobility during this session.      Pertinent Vitals/Pain Pain Assessment: No/denies pain    Home Living                      Prior Function            PT Goals (current goals can now be found in the care plan section) Acute Rehab PT Goals PT Goal Formulation: With patient Time For Goal Achievement: 02/26/14 Potential to Achieve Goals: Good Progress towards PT goals: Progressing toward goals    Frequency  Min 3X/week    PT Plan Current plan remains appropriate    Co-evaluation             End of Session Equipment Utilized During Treatment: Gait belt;Left knee immobilizer Activity Tolerance: Patient tolerated treatment well Patient left: in chair;with call bell/phone within reach     Time: 1313-1336 PT Time Calculation (min) (ACUTE ONLY): 23 min  Charges:  $Gait Training: 8-22 mins $Therapeutic Activity: 8-22 mins                    G Codes:      Allayne Gitelman 02/16/2014, 3:59 PM

## 2014-02-16 NOTE — Progress Notes (Signed)
Patient slept all night after 0000. Patient is up and in the chair. She does not remember any of her agitation and impulsive behavior from last night. Alert and oriented completely and received pain medication. Hydralazine was not given due to blood pressure being below parameters. Will continue to monitor patient.

## 2014-02-16 NOTE — Discharge Summary (Signed)
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 02/12/2014   K 3.9 02/12/2014   CL 106 02/12/2014   CO2 24 02/12/2014   BUN 13 02/12/2014   CREATININE 0.53 02/12/2014   PROT 6.2 02/11/2014   ALBUMIN 3.6 02/11/2014   BILITOT 1.0 02/11/2014   ALKPHOS 79 02/11/2014   AST 21 02/11/2014   ALT 14 02/11/2014  .   Total Time in preparing paper work, data evaluation and todays exam - 35 minutes  Thurnell Lose M.D on 02/16/2014 at 10:34 AM  [facility] Office  [phone]

## 2014-02-16 NOTE — Progress Notes (Signed)
UR completed Lajuanda Penick K. Bonne Whack, RN, BSN, Donald, CCM  02/16/2014 4:38 PM

## 2014-02-16 NOTE — Progress Notes (Signed)
Attempted to call report to [facility]. Did not get an answer at [phone], Hulen Skains [phone] supervisor phone number and left message on voicemail to return call so that i can given report to nurse.

## 2014-02-16 NOTE — Progress Notes (Signed)
Pt has been up in the chair at bedside for most of the day. Has not complained of any pain this shift. Skin warm and dry. Heart rate regular. No sob noted. Patient does have some confusion and forgetfulness.

## 2014-02-16 NOTE — Care Management Note (Signed)
    Page 1 of 1   02/16/2014     4:35:58 PM CARE MANAGEMENT NOTE 02/16/2014  Patient:  Monique Rush, Monique Rush   Account Number:  [id number]  Date Initiated:  02/12/2014  Documentation initiated by:  MAYO,HENRIETTA  Subjective/Objective Assessment:   dx femoral neck fx; lives alone     Action/Plan:   ST-SNF for rehab   Anticipated DC Date:  02/16/2014   Anticipated DC Plan:  IP REHAB FACILITY  In-house referral  Clinical Social Worker         Choice offered to / List presented to:             Status of service:  Completed, signed off Medicare Important Message given?  YES (If response is "NO", the following Medicare IM given date fields will be blank) Date Medicare IM given:  02/16/2014 Medicare IM given by:  Halo Laski Date Additional Medicare IM given:   Additional Medicare IM given by:    Discharge Disposition:  [place]  Per UR Regulation:  Reviewed for med. necessity/level of care/duration of stay  If discussed at [place] of Stay Meetings, dates discussed:   02/17/2014    Comments:  Mariann Laster RN, BSN, MSHL, CCM  Nurse - Case Manager,  (Unit [facility])  [phone]   02/16/2014 Appropriate for CIR, however, patient medically ready for d/c today. Disposition: SNF/[facility] for Rehab services - (please see Kendell Bane, SW note for further details).

## 2014-02-16 NOTE — Progress Notes (Signed)
Transport here to pick up patient. Patient is refusing to leave. She gets confused at night, and now she doesn't remember that she was going to the facility. Daughter called her and she is now agreeing to go.

## 2014-02-16 NOTE — Discharge Instructions (Signed)
WBAT LLE with therapy and walker, May shower, maintain surgical dressing for 2 weeks or follow up.   Follow with Primary MD No PCP Per Patient in 7 days   Get CBC, CMP, 2 view Chest X ray checked  by Primary MD next visit.    Activity: As tolerated with Full fall precautions use walker/cane & assistance as needed   Disposition SNF   Diet: Heart Healthy  with feeding assistance and aspiration precautions as needed.  For Heart failure patients - Check your Weight same time everyday, if you gain over 2 pounds, or you develop in leg swelling, experience more shortness of breath or chest pain, call your Primary MD immediately. Follow Cardiac Low Salt Diet and 1.8 lit/day fluid restriction.   On your next visit with your primary care physician please Get Medicines reviewed and adjusted.   Please request your Prim.MD to go over all Hospital Tests and Procedure/Radiological results at the follow up, please get all Hospital records sent to your Prim MD by signing hospital release before you go home.   If you experience worsening of your admission symptoms, develop shortness of breath, life threatening emergency, suicidal or homicidal thoughts you must seek medical attention immediately by calling 911 or calling your MD immediately  if symptoms less severe.  You Must read complete instructions/literature along with all the possible adverse reactions/side effects for all the Medicines you take and that have been prescribed to you. Take any new Medicines after you have completely understood and accpet all the possible adverse reactions/side effects.   Do not drive, operating heavy machinery, perform activities at heights, swimming or participation in water activities or provide baby sitting services if your were admitted for syncope or siezures until you have seen by Primary MD or a Neurologist and advised to do so again.  Do not drive when taking Pain medications.    Do not take more than  prescribed Pain, Sleep and Anxiety Medications  Special Instructions: If you have smoked or chewed Tobacco  in the last 2 yrs please stop smoking, stop any regular Alcohol  and or any Recreational drug use.  Wear Seat belts while driving.   Please note  You were cared for by a hospitalist during your hospital stay. If you have any questions about your discharge medications or the care you received while you were in the hospital after you are discharged, you can call the unit and asked to speak with the hospitalist on call if the hospitalist that took care of you is not available. Once you are discharged, your primary care physician will handle any further medical issues. Please note that NO REFILLS for any discharge medications will be authorized once you are discharged, as it is imperative that you return to your primary care physician (or establish a relationship with a primary care physician if you do not have one) for your aftercare needs so that they can reassess your need for medications and monitor your lab values.

## 2014-02-16 NOTE — Progress Notes (Signed)
Around 1945 patient became very agitated and upset not making any sense in her conversations. She would try to get up out of bed without calling, and is a high fall risk. She started to to throw things all over her room and into the hallway. MD was made aware and ativan was ordered. Patient was given ativan and is in and out of sleep. Will continue to monitor patient.

## 2014-02-17 LAB — CULTURE, BLOOD (ROUTINE X 2)
Culture: NO GROWTH
Culture: NO GROWTH

## 2014-02-18 NOTE — Clinical Social Work Placement (Signed)
     Clinical Social Work Department CLINICAL SOCIAL WORK PLACEMENT NOTE 02/16/2014  Patient:  Monique Rush, Monique Rush  Account Number:  [id number] Admit date:  02/10/2014  Clinical Social Worker:  Domenica Reamer, CLINICAL SOCIAL WORKER  Date/time:  02/12/2014 12:46 PM  Clinical Social Work is seeking post-discharge placement for this patient at the following level of care:   SKILLED NURSING   (*CSW will update this form in Epic as items are completed)   02/12/2014  Patient/family provided with [place] Department of Clinical Social Works list of facilities offering this level of care within the geographic area requested by the patient (or if unable, by the patients family).  02/12/2014  Patient/family informed of their freedom to choose among providers that offer the needed level of care, that participate in Medicare, Medicaid or managed care program needed by the patient, have an available bed and are willing to accept the patient.  02/12/2014  Patient/family informed of [facility] ownership interest in [facility], as well as of the fact that they are under no obligation to receive care at this facility.  PASARR submitted to EDS on 02/12/2014 PASARR number received on 02/12/2014  FL2 transmitted to all facilities in geographic area requested by pt/family on  02/12/2014 FL2 transmitted to all facilities within larger geographic area on   Patient informed that his/her managed care company has contracts with or will negotiate with  certain facilities, including the following:   [place] received prior to d/c.  #[id number]     Patient/family informed of bed offers received:  02/16/2014 Patient chooses bed at [facility] Physician recommends and patient chooses bed at    Patient to be transferred to [place] on  02/16/2014 Patient to be transferred to facility by Ambulance  [facility]) Patient and family notified of transfer on  02/16/2014 Name of family member notified:  Jetta Lout- Daughter  The following physician request were entered in Epic: Physician Request  Please prepare priority discharge summary and prescriptions.  Please sign FL2.    Additional Comments: 02/16/14  OK per MD for d/c today to SNF. Daughter and patient were pleased with facility of choice and patient was fully agreeable to d/c throughout the day. When the EMS transport came to pick her up- she originally did not want to leave the hospital stating "I just want to lay down tonight and rest- I'll go tomorrow."  CSW spoke with patient and was unable to convince her of stability for d/c. Her daughter was notified and spoke to patient via phone and patient relented to be d/c'd to SNF.  Nursing notified to call report.  CSW signing off.  Lorie Phenix. [place], [place]

## 2014-02-20 ENCOUNTER — Encounter (HOSPITAL_COMMUNITY): Payer: Self-pay | Admitting: Emergency Medicine

## 2014-02-20 ENCOUNTER — Emergency Department (HOSPITAL_COMMUNITY): Payer: Medicare Other

## 2014-02-20 ENCOUNTER — Emergency Department (HOSPITAL_COMMUNITY)
Admission: EM | Admit: 2014-02-20 | Discharge: 2014-02-20 | Disposition: A | Discharge status: Home / Self Care | Payer: Medicare Other | Attending: Emergency Medicine | Admitting: Emergency Medicine

## 2014-02-20 DIAGNOSIS — R109 Unspecified abdominal pain: Secondary | ICD-10-CM

## 2014-02-20 DIAGNOSIS — I10 Essential (primary) hypertension: Secondary | ICD-10-CM | POA: Diagnosis not present

## 2014-02-20 DIAGNOSIS — Z7982 Long term (current) use of aspirin: Secondary | ICD-10-CM | POA: Insufficient documentation

## 2014-02-20 DIAGNOSIS — K5909 Other constipation: Secondary | ICD-10-CM | POA: Diagnosis not present

## 2014-02-20 DIAGNOSIS — Z79899 Other long term (current) drug therapy: Secondary | ICD-10-CM | POA: Insufficient documentation

## 2014-02-20 DIAGNOSIS — N390 Urinary tract infection, site not specified: Secondary | ICD-10-CM | POA: Diagnosis not present

## 2014-02-20 DIAGNOSIS — Z8669 Personal history of other diseases of the nervous system and sense organs: Secondary | ICD-10-CM | POA: Insufficient documentation

## 2014-02-20 DIAGNOSIS — R110 Nausea: Secondary | ICD-10-CM | POA: Insufficient documentation

## 2014-02-20 HISTORY — DX: Encephalopathy, unspecified: G93.40

## 2014-02-20 HISTORY — DX: Essential (primary) hypertension: I10

## 2014-02-20 HISTORY — DX: Retention of urine, unspecified: R33.9

## 2014-02-20 LAB — COMPREHENSIVE METABOLIC PANEL
ALT: 12 U/L (ref 0–35)
AST: 13 U/L (ref 0–37)
Albumin: 3.3 g/dL — ABNORMAL LOW (ref 3.5–5.2)
Alkaline Phosphatase: 89 U/L (ref 39–117)
Anion gap: 7 (ref 5–15)
BILIRUBIN TOTAL: 0.4 mg/dL (ref 0.3–1.2)
BUN: 17 mg/dL (ref 6–23)
CHLORIDE: 98 meq/L (ref 96–112)
CO2: 30 mmol/L (ref 19–32)
Calcium: 9.4 mg/dL (ref 8.4–10.5)
Creatinine, Ser: 0.6 mg/dL (ref 0.50–1.10)
GFR, EST NON AFRICAN AMERICAN: 88 mL/min — AB (ref >90)
Glucose, Bld: 115 mg/dL — ABNORMAL HIGH (ref 70–99)
POTASSIUM: 3.8 mmol/L (ref 3.5–5.1)
SODIUM: 135 mmol/L (ref 135–145)
Total Protein: 6.4 g/dL (ref 6.0–8.3)

## 2014-02-20 LAB — URINALYSIS, ROUTINE W REFLEX MICROSCOPIC
BILIRUBIN URINE: NEGATIVE
GLUCOSE, UA: NEGATIVE mg/dL
Hgb urine dipstick: NEGATIVE
KETONES UR: NEGATIVE mg/dL
Nitrite: NEGATIVE
PROTEIN: NEGATIVE mg/dL
Specific Gravity, Urine: 1.015 (ref 1.005–1.030)
Urobilinogen, UA: 1 mg/dL (ref 0.0–1.0)
pH: 6.5 (ref 5.0–8.0)

## 2014-02-20 LAB — CBC WITH DIFFERENTIAL/PLATELET
BASOS ABS: 0 10*3/uL (ref 0.0–0.1)
BASOS PCT: 0 % (ref 0–1)
EOS ABS: 0.2 10*3/uL (ref 0.0–0.7)
Eosinophils Relative: 2 % (ref 0–5)
HCT: 33.5 % — ABNORMAL LOW (ref 36.0–46.0)
Hemoglobin: 10.8 g/dL — ABNORMAL LOW (ref 12.0–15.0)
Lymphocytes Relative: 13 % (ref 12–46)
Lymphs Abs: 2.1 10*3/uL (ref 0.7–4.0)
MCH: 29.9 pg (ref 26.0–34.0)
MCHC: 32.2 g/dL (ref 30.0–36.0)
MCV: 92.8 fL (ref 78.0–100.0)
Monocytes Absolute: 1.4 10*3/uL — ABNORMAL HIGH (ref 0.1–1.0)
Monocytes Relative: 9 % (ref 3–12)
Neutro Abs: 12.3 10*3/uL — ABNORMAL HIGH (ref 1.7–7.7)
Neutrophils Relative %: 76 % (ref 43–77)
PLATELETS: 450 10*3/uL — AB (ref 150–400)
RBC: 3.61 MIL/uL — ABNORMAL LOW (ref 3.87–5.11)
RDW: 12.3 % (ref 11.5–15.5)
WBC: 16.1 10*3/uL — ABNORMAL HIGH (ref 4.0–10.5)

## 2014-02-20 LAB — URINE MICROSCOPIC-ADD ON

## 2014-02-20 LAB — LIPASE, BLOOD: LIPASE: 18 U/L (ref 11–59)

## 2014-02-20 MED ORDER — DEXTROSE 5 % IV SOLN
1.0000 g | Freq: Once | INTRAVENOUS | Status: AC
  Administered 2014-02-20: 1 g via INTRAVENOUS
  Filled 2014-02-20: qty 10

## 2014-02-20 MED ORDER — DOCUSATE SODIUM 100 MG PO CAPS: 100.0000 mg | ORAL_CAPSULE | Freq: Two times a day (BID) | ORAL | Status: DC | PRN

## 2014-02-20 MED ORDER — BISACODYL 10 MG RE SUPP
10.0000 mg | Freq: Once | RECTAL | Status: AC
  Administered 2014-02-20: 10 mg via RECTAL
  Filled 2014-02-20: qty 1

## 2014-02-20 MED ORDER — IOHEXOL 300 MG/ML  SOLN
25.0000 mL | Freq: Once | INTRAMUSCULAR | Status: AC | PRN
  Administered 2014-02-20: 25 mL via ORAL

## 2014-02-20 MED ORDER — IOHEXOL 300 MG/ML  SOLN
100.0000 mL | Freq: Once | INTRAMUSCULAR | Status: AC | PRN
  Administered 2014-02-20: 100 mL via INTRAVENOUS

## 2014-02-20 MED ORDER — CEPHALEXIN 500 MG PO CAPS: 500.0000 mg | ORAL_CAPSULE | Freq: Four times a day (QID) | ORAL | Status: DC

## 2014-02-20 NOTE — Progress Notes (Signed)
CSW met with patient at bedside. Son and daughter in law were present. Patient refuses to be discharged back to [facility]. Patient says that she does not feel as thought she is getting the help she needs. Patient says that staff at the facility are not helping her bath. Patient stated that "all they do is bring me food". Patient says she feels as if she is taking care of herself, so she might as well be at home.  According to patient she is currently at [facility] for rehab. She states that she just recently had a hip replacement and has been there for the last 4-5 days. Patient says that she is scared of staff and that cannot rest there.  Patient says that she has a home in [place] ([address] and would much rather go back there. Patient and son are aware that her rehab services are currently at [facility]. Patient chooses to be discharged home. Son is agreeable to plan and will take the patient home. Patient states her granddaughter will be staying the night with her.  CSW gave patient odbudsman information.   Son/Jeff [phone]  Willette Brace [phone] ED CSW 02/20/2014 10:20 PM

## 2014-02-20 NOTE — ED Notes (Signed)
Patient Is unable to urinate

## 2014-02-20 NOTE — ED Notes (Signed)
Pt states abdominal pain and diarrhea x 3-4 days. 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

## 2014-02-20 NOTE — Discharge Instructions (Signed)
It was our pleasure to provide your ER care today - we hope that you feel better.  The lab work shows a possible urine infection - take keflex (antibiotic) as prescribed.  A urine culture was sent the results of which will be back in 2 days time, have your doctor follow up on those results then.  Your scans show a large amount of stool in your colon.  Drink plenty of fluids, get adequate fiber in diet, take colace (stool softener), and use miralax (laxative) as need.  Follow up with your primary care doctor in the next couple days for recheck.  Your ct scan was read as follows:  IMPRESSION: 1. Gas within the mildly distended urinary bladder, presumably related to recent instrumentation. If there has not been recent instrumentation, UTI with gas-forming organism is possible. 2. No acute abnormalities otherwise involving the abdomen or pelvis. 3. Multiple right renal calculi as detailed above, with mild right hydronephrosis. However, there is no evidence of obstruction currently as there is no delay in contrast excretion by the right kidney when compared to the left. 4. Very large stool burden throughout the entire colon, with possible rectal fecal impaction. 5. Nonspecific edema/inflammation in the presacral soft tissues. I suspect this is just dependent edema based on her recent surgery and the likelihood to remain in bed in the supine position. 6. Mild pancreatic atrophy, particularly the pancreatic head. 7. Mild bronchiectasis involving the lower lobes. 8. Fluid collection surrounding the left hip. This may just be a postoperative seroma or fluid within the trochanteric bursa.  Discuss above ct scan results with your doctor.   Return to ER if worse, new symptoms, high fevers, persistent vomiting, severe abdominal pain, redness/infection or increased pain at hip surgical site, other concern.        Abdominal Pain Many things can cause abdominal pain. Usually, abdominal pain is  not caused by a disease and will improve without treatment. It can often be observed and treated at home. Your health care provider will do a physical exam and possibly order blood tests and X-rays to help determine the seriousness of your pain. However, in many cases, more time must pass before a clear cause of the pain can be found. Before that point, your health care provider may not know if you need more testing or further treatment. HOME CARE INSTRUCTIONS  Monitor your abdominal pain for any changes. The following actions may help to alleviate any discomfort you are experiencing:  Only take over-the-counter or prescription medicines as directed by your health care provider.  Do not take laxatives unless directed to do so by your health care provider.  Try a clear liquid diet (broth, tea, or water) as directed by your health care provider. Slowly move to a bland diet as tolerated. SEEK MEDICAL CARE IF:  You have unexplained abdominal pain.  You have abdominal pain associated with nausea or diarrhea.  You have pain when you urinate or have a bowel movement.  You experience abdominal pain that wakes you in the night.  You have abdominal pain that is worsened or improved by eating food.  You have abdominal pain that is worsened with eating fatty foods.  You have a fever. SEEK IMMEDIATE MEDICAL CARE IF:   Your pain does not go away within 2 hours.  You keep throwing up (vomiting).  Your pain is felt only in portions of the abdomen, such as the right side or the left lower portion of the abdomen.  You  pass bloody or black tarry stools. MAKE SURE YOU:  Understand these instructions.   Will watch your condition.   Will get help right away if you are not doing well or get worse.  Document Released: 11/16/2004 Document Revised: 02/11/2013 Document Reviewed: 10/16/2012 [facility] Patient Information 2015 [place], [place]. This information is not intended to replace advice given to  you by your health care provider. Make sure you discuss any questions you have with your health care provider.    Constipation Constipation is when a person has fewer than three bowel movements a week, has difficulty having a bowel movement, or has stools that are dry, hard, or larger than normal. As people grow older, constipation is more common. If you try to fix constipation with medicines that make you have a bowel movement (laxatives), the problem may get worse. Long-term laxative use may cause the muscles of the colon to become weak. A low-fiber diet, not taking in enough fluids, and taking certain medicines may make constipation worse.  CAUSES   Certain medicines, such as antidepressants, pain medicine, iron supplements, antacids, and water pills.   Certain diseases, such as diabetes, irritable bowel syndrome (IBS), thyroid disease, or depression.   Not drinking enough water.   Not eating enough fiber-rich foods.   Stress or travel.   Lack of physical activity or exercise.   Ignoring the urge to have a bowel movement.   Using laxatives too much.  SIGNS AND SYMPTOMS   Having fewer than three bowel movements a week.   Straining to have a bowel movement.   Having stools that are hard, dry, or larger than normal.   Feeling full or bloated.   Pain in the lower abdomen.   Not feeling relief after having a bowel movement.  DIAGNOSIS  Your health care provider will take a medical history and perform a physical exam. Further testing may be done for severe constipation. Some tests may include:  A barium enema X-ray to examine your rectum, colon, and, sometimes, your small intestine.   A sigmoidoscopy to examine your lower colon.   A colonoscopy to examine your entire colon. TREATMENT  Treatment will depend on the severity of your constipation and what is causing it. Some dietary treatments include drinking more fluids and eating more fiber-rich foods.  Lifestyle treatments may include regular exercise. If these diet and lifestyle recommendations do not help, your health care provider may recommend taking over-the-counter laxative medicines to help you have bowel movements. Prescription medicines may be prescribed if over-the-counter medicines do not work.  HOME CARE INSTRUCTIONS   Eat foods that have a lot of fiber, such as fruits, vegetables, whole grains, and beans.  Limit foods high in fat and processed sugars, such as french fries, hamburgers, cookies, candies, and soda.   A fiber supplement may be added to your diet if you cannot get enough fiber from foods.   Drink enough fluids to keep your urine clear or pale yellow.   Exercise regularly or as directed by your health care provider.   Go to the restroom when you have the urge to go. Do not hold it.   Only take over-the-counter or prescription medicines as directed by your health care provider. Do not take other medicines for constipation without talking to your health care provider first.  [place] IF:   You have bright red blood in your stool.   Your constipation lasts for more than 4 days or gets worse.   You  have abdominal or rectal pain.   You have thin, pencil-like stools.   You have unexplained weight loss. MAKE SURE YOU:   Understand these instructions.  Will watch your condition.  Will get help right away if you are not doing well or get worse. Document Released: 11/05/2003 Document Revised: 02/11/2013 Document Reviewed: 11/18/2012 [facility] Patient Information 2015 [place], [place]. This information is not intended to replace advice given to you by your health care provider. Make sure you discuss any questions you have with your health care provider.     Urinary Tract Infection Urinary tract infections (UTIs) can develop anywhere along your urinary tract. Your urinary tract is your body's drainage system for removing wastes and  extra water. Your urinary tract includes two kidneys, two ureters, a bladder, and a urethra. Your kidneys are a pair of bean-shaped organs. Each kidney is about the size of your fist. They are located below your ribs, one on each side of your spine. CAUSES Infections are caused by microbes, which are microscopic organisms, including fungi, viruses, and bacteria. These organisms are so small that they can only be seen through a microscope. Bacteria are the microbes that most commonly cause UTIs. SYMPTOMS  Symptoms of UTIs may vary by age and gender of the patient and by the location of the infection. Symptoms in young women typically include a frequent and intense urge to urinate and a painful, burning feeling in the bladder or urethra during urination. Older women and men are more likely to be tired, shaky, and weak and have muscle aches and abdominal pain. A fever may mean the infection is in your kidneys. Other symptoms of a kidney infection include pain in your back or sides below the ribs, nausea, and vomiting. DIAGNOSIS To diagnose a UTI, your caregiver will ask you about your symptoms. Your caregiver also will ask to provide a urine sample. The urine sample will be tested for bacteria and white blood cells. White blood cells are made by your body to help fight infection. TREATMENT  Typically, UTIs can be treated with medication. Because most UTIs are caused by a bacterial infection, they usually can be treated with the use of antibiotics. The choice of antibiotic and length of treatment depend on your symptoms and the type of bacteria causing your infection. HOME CARE INSTRUCTIONS  If you were prescribed antibiotics, take them exactly as your caregiver instructs you. Finish the medication even if you feel better after you have only taken some of the medication.  Drink enough water and fluids to keep your urine clear or pale yellow.  Avoid caffeine, tea, and carbonated beverages. They tend to  irritate your bladder.  Empty your bladder often. Avoid holding urine for long periods of time.  Empty your bladder before and after sexual intercourse.  After a bowel movement, women should cleanse from front to back. Use each tissue only once. SEEK MEDICAL CARE IF:   You have back pain.  You develop a fever.  Your symptoms do not begin to resolve within 3 days. SEEK IMMEDIATE MEDICAL CARE IF:   You have severe back pain or lower abdominal pain.  You develop chills.  You have nausea or vomiting.  You have continued burning or discomfort with urination. MAKE SURE YOU:   Understand these instructions.  Will watch your condition.  Will get help right away if you are not doing well or get worse. Document Released: 11/16/2004 Document Revised: 08/08/2011 Document Reviewed: 03/17/2011 [facility] Patient Information 2015 [place], [place].  This information is not intended to replace advice given to you by your health care provider. Make sure you discuss any questions you have with your health care provider. ° °

## 2014-02-20 NOTE — ED Notes (Signed)
Patient transported to CT 
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
## 2014-02-22 LAB — URINE CULTURE
CULTURE: NO GROWTH
Colony Count: NO GROWTH

## 2014-02-24 ENCOUNTER — Ambulatory Visit: Payer: Medicare Other | Admitting: Neurology

## 2014-02-24 ENCOUNTER — Telehealth: Payer: Self-pay | Admitting: Neurology

## 2014-02-24 NOTE — Telephone Encounter (Signed)
Patient's daughter, Butch Penny, returning Jayme Cloud call from 02/23/14.

## 2014-02-25 NOTE — Telephone Encounter (Signed)
Spoke with Butch Penny patient has been rescheduled , Stated please bring medication , Insurance and told Butch Penny she would need to attend apt. Butch Penny understood process.

## 2014-03-09 ENCOUNTER — Ambulatory Visit: Payer: Self-pay | Admitting: Neurology

## 2014-03-13 ENCOUNTER — Ambulatory Visit: Payer: Self-pay | Admitting: Internal Medicine

## 2014-03-19 ENCOUNTER — Ambulatory Visit: Payer: Medicare Other | Admitting: Neurology

## 2014-03-20 ENCOUNTER — Ambulatory Visit: Payer: Self-pay | Admitting: Internal Medicine

## 2014-03-23 ENCOUNTER — Ambulatory Visit: Payer: Self-pay | Admitting: Internal Medicine

## 2014-03-24 ENCOUNTER — Ambulatory Visit: Payer: Medicare Other | Admitting: Neurology

## 2014-03-25 ENCOUNTER — Ambulatory Visit: Payer: Medicare Other | Admitting: Neurology

## 2014-04-02 ENCOUNTER — Encounter: Payer: Self-pay | Admitting: Neurology

## 2014-04-02 ENCOUNTER — Ambulatory Visit (INDEPENDENT_AMBULATORY_CARE_PROVIDER_SITE_OTHER): Payer: Medicare Other | Admitting: Neurology

## 2014-04-02 VITALS — BP 173/79 | HR 72 | Ht 68.5 in | Wt 164.0 lb

## 2014-04-02 DIAGNOSIS — I6783 Posterior reversible encephalopathy syndrome: Secondary | ICD-10-CM

## 2014-04-02 NOTE — Progress Notes (Signed)
PATIENT: Monique Rush DOB: [birth year]  HISTORICAL  Monique Rush 24 is a 74 years old right-handed Caucasian female, accompanied by her son, referred by her primary care physician Dr. Vista Lawman for evaluation of acute onset confusion, abnormal MRI scans.  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  In December 23rd 2015, her son who has seen her at her baseline a day before, could not reach her by phone, went to check on her, found she was confused, by the closet, was taken by ambulance to [facility], she suffered left hip fracture, MRI of brain showed diffuse altered signal intensity within the left parietal-occipital lobe extending to the posterior aspect of the temporal lobes bilaterally. 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 Tiny petechial hemorrhage within the the parietal-occipital lobe greater on the left.  Laboratory showed evidence of UTI, elevated white count, dehydration,  Her confusion has much improved, she was diagnosed with encephalopathy, due to presence syndrome, blood pressure 140s-180s/50s-120s, she had left hip hemiarthroplasty. Was discharged [place] temporarily, she is now back to her own home, no longer has confusion, has no recollection of the event  She had baseline gait difficulty due to previous left leg injury.  She has mild memory trouble, but was highly function prior to this event, was driving, manage her own finances,  REVIEW OF SYSTEMS: Full 14 system review of systems performed and notable only for swelling in legs, confusion, depression, anxiety, not enough sleep, insomnia, sleepiness  ALLERGIES: Allergies  Allergen Reactions  . Morphine And Related Nausea And Vomiting  . Oxycontin [Oxycodone Hcl] Nausea And Vomiting  . Percocet [Oxycodone-Acetaminophen] Nausea And  Vomiting    HOME MEDICATIONS: Current Outpatient Prescriptions  Medication Sig Dispense Refill  . amLODipine (NORVASC) 10 MG tablet Take 1 tablet (10 mg total) by mouth daily.    Marland Kitchen aspirin EC 325 MG tablet Take 1 tablet (325 mg total) by mouth 2 (two) times daily. Take for 4 weeks 60 tablet 0  . baclofen (LIORESAL) 10 MG tablet   0  . carvedilol (COREG) 12.5 MG tablet Take 1 tablet (12.5 mg total) by mouth 2 (two) times daily with a meal.    . hydrALAZINE (APRESOLINE) 25 MG tablet Take 1 tablet (25 mg total) by mouth every 8 (eight) hours. (Patient taking differently: Take 25 mg by mouth every 8 (eight) hours. 0:00, 08:00, 16:00)    . donepezil (ARICEPT) 5 MG tablet Take 1 tablet (5 mg total) by mouth at bedtime.     No current facility-administered medications for this visit.    PAST MEDICAL HISTORY: Past Medical History  Diagnosis Date  . Hypertension   . Encephalopathy   . Urine retention     PAST SURGICAL HISTORY: Past Surgical History  Procedure Laterality Date  . Cholecystectomy    . Hip arthroplasty Left 02/11/2014    Procedure: ARTHROPLASTY BIPOLAR HIP;  Surgeon: Mauri Pole, MD;  Location: [facility];  Service: Orthopedics;  Laterality: Left;  . Ankle surgery      Left    FAMILY HISTORY: Family History  Problem Relation Age of Onset  . Alzheimer's disease Mother   . Heart disease Father     SOCIAL HISTORY:  History   Social History  . Marital Status: Married    Spouse Name: N/A  . Number of Children: 2  .  Years of Education: 12   Occupational History  . Retired    Social History Main Topics  . Smoking status: Never Smoker   . Smokeless tobacco: Never Used  . Alcohol Use: No  . Drug Use: No  . Sexual Activity: No   Other Topics Concern  . Not on file   Social History Narrative   Lives at home alone.  Son lives two blocks away.   Right-handed.   No caffeine use.     PHYSICAL EXAM   Filed Vitals:   04/02/14 1212  BP: 173/79  Pulse: 72    Height: 5' 8.5" (1.74 m)  Weight: 164 lb (74.39 kg)    Not recorded      Body mass index is 24.57 kg/(m^2).   Generalized: In no acute distress  Neck: Supple, no carotid bruits   Cardiac: Regular rate rhythm  Pulmonary: Clear to auscultation bilaterally  Musculoskeletal: No deformity  Neurological examination  Mentation: Alert oriented to time, place, history taking, and causual conversation, Mini-Mental Status Examination 28 out of 30, she missed 2 out of 3 recalls.  Cranial nerve II-XII: Pupils were equal round reactive to light. Extraocular movements were full.  Visual field were full on confrontational test. Bilateral fundi were sharp.  Facial sensation and strength were normal. Hearing was intact to finger rubbing bilaterally. Uvula tongue midline.  Head turning and shoulder shrug and were normal and symmetric.Tongue protrusion into cheek strength was normal.  Motor: Normal tone, bulk and strength, she has bilateral lower extremity edema, swelling, left worse than right,  Sensory: Intact to fine touch, pinprick, preserved vibratory sensation, and proprioception at toes.  Coordination: Normal finger to nose, heel-to-shin bilaterally there was no truncal ataxia  Gait: Deformity of left leg, dragging her left leg, unsteady  Deep tendon reflexes: Brachioradialis 2/2, biceps 2/2, triceps 2/2, patellar 2/2, Achilles trace, plantar responses were flexor bilaterally.   DIAGNOSTIC DATA (LABS, IMAGING, TESTING) - I reviewed patient records, labs, notes, testing and imaging myself where available.  Lab Results  Component Value Date   WBC 16.1* 02/20/2014   HGB 10.8* 02/20/2014   HCT 33.5* 02/20/2014   MCV 92.8 02/20/2014   PLT 450* 02/20/2014      Component Value Date/Time   NA 135 02/20/2014 1435   K 3.8 02/20/2014 1435   CL 98 02/20/2014 1435   CO2 30 02/20/2014 1435   GLUCOSE 115* 02/20/2014 1435   BUN 17 02/20/2014 1435   CREATININE 0.60 02/20/2014 1435    CALCIUM 9.4 02/20/2014 1435   PROT 6.4 02/20/2014 1435   ALBUMIN 3.3* 02/20/2014 1435   AST 13 02/20/2014 1435   ALT 12 02/20/2014 1435   ALKPHOS 89 02/20/2014 1435   BILITOT 0.4 02/20/2014 1435   GFRNONAA 88* 02/20/2014 1435   GFRAA >90 02/20/2014 1435   No results found for: CHOL, HDL, LDLCALC, LDLDIRECT, TRIG, CHOLHDL No results found for: HGBA1C No results found for: VITAMINB12 Lab Results  Component Value Date   TSH 2.200 02/10/2014      ASSESSMENT AND PLAN  ELNOR RENOVATO is a 74 y.o. female  presenting with acute onset confusion, elevated blood pressure, abnormal MRI of the brain, consistent with posterior reversible encephalopathy, in the setting of UTI, she is now much improved, her mother had Alzheimer's disease  1, she has much improved, today's Mini-Mental status is 28 out of 30, 2, she will follow up with her primary care physician, please check TSH, B12, 3, return to clinic in 3  months   Marcial Pacas, M.D. Ph.D.  [facility] [address], [place], [place] [address] Ph: [phone] Fax: [phone]

## 2014-06-21 ENCOUNTER — Encounter (HOSPITAL_COMMUNITY): Payer: Self-pay | Admitting: Family Medicine

## 2014-06-21 ENCOUNTER — Emergency Department (HOSPITAL_COMMUNITY): Payer: Medicare Other

## 2014-06-21 ENCOUNTER — Emergency Department (HOSPITAL_COMMUNITY)
Admission: EM | Admit: 2014-06-21 | Discharge: 2014-06-22 | Disposition: A | Discharge status: Home / Self Care | Payer: Medicare Other | Attending: Emergency Medicine | Admitting: Emergency Medicine

## 2014-06-21 DIAGNOSIS — Y998 Other external cause status: Secondary | ICD-10-CM | POA: Diagnosis not present

## 2014-06-21 DIAGNOSIS — Z79899 Other long term (current) drug therapy: Secondary | ICD-10-CM | POA: Insufficient documentation

## 2014-06-21 DIAGNOSIS — Y9289 Other specified places as the place of occurrence of the external cause: Secondary | ICD-10-CM | POA: Diagnosis not present

## 2014-06-21 DIAGNOSIS — I10 Essential (primary) hypertension: Secondary | ICD-10-CM | POA: Insufficient documentation

## 2014-06-21 DIAGNOSIS — Z7982 Long term (current) use of aspirin: Secondary | ICD-10-CM | POA: Insufficient documentation

## 2014-06-21 DIAGNOSIS — Y9389 Activity, other specified: Secondary | ICD-10-CM | POA: Diagnosis not present

## 2014-06-21 DIAGNOSIS — Z791 Long term (current) use of non-steroidal anti-inflammatories (NSAID): Secondary | ICD-10-CM | POA: Diagnosis not present

## 2014-06-21 DIAGNOSIS — W01198A Fall on same level from slipping, tripping and stumbling with subsequent striking against other object, initial encounter: Secondary | ICD-10-CM | POA: Diagnosis not present

## 2014-06-21 DIAGNOSIS — R52 Pain, unspecified: Secondary | ICD-10-CM

## 2014-06-21 DIAGNOSIS — S73005A Unspecified dislocation of left hip, initial encounter: Secondary | ICD-10-CM

## 2014-06-21 DIAGNOSIS — S79912A Unspecified injury of left hip, initial encounter: Secondary | ICD-10-CM | POA: Diagnosis present

## 2014-06-21 DIAGNOSIS — M25559 Pain in unspecified hip: Secondary | ICD-10-CM

## 2014-06-21 DIAGNOSIS — Y838 Other surgical procedures as the cause of abnormal reaction of the patient, or of later complication, without mention of misadventure at the time of the procedure: Secondary | ICD-10-CM | POA: Diagnosis not present

## 2014-06-21 DIAGNOSIS — Z8669 Personal history of other diseases of the nervous system and sense organs: Secondary | ICD-10-CM | POA: Insufficient documentation

## 2014-06-21 DIAGNOSIS — T84021A Dislocation of internal left hip prosthesis, initial encounter: Secondary | ICD-10-CM | POA: Insufficient documentation

## 2014-06-21 HISTORY — DX: Insomnia, unspecified: G47.00

## 2014-06-21 LAB — CBC WITH DIFFERENTIAL/PLATELET
BASOS ABS: 0.1 10*3/uL (ref 0.0–0.1)
Basophils Relative: 1 % (ref 0–1)
Eosinophils Absolute: 0.2 10*3/uL (ref 0.0–0.7)
Eosinophils Relative: 2 % (ref 0–5)
HCT: 34.8 % — ABNORMAL LOW (ref 36.0–46.0)
HEMOGLOBIN: 11.2 g/dL — AB (ref 12.0–15.0)
Lymphocytes Relative: 8 % — ABNORMAL LOW (ref 12–46)
Lymphs Abs: 0.9 10*3/uL (ref 0.7–4.0)
MCH: 29.9 pg (ref 26.0–34.0)
MCHC: 32.2 g/dL (ref 30.0–36.0)
MCV: 92.8 fL (ref 78.0–100.0)
MONO ABS: 0.7 10*3/uL (ref 0.1–1.0)
Monocytes Relative: 6 % (ref 3–12)
NEUTROS ABS: 9.8 10*3/uL — AB (ref 1.7–7.7)
NEUTROS PCT: 83 % — AB (ref 43–77)
Platelets: 295 10*3/uL (ref 150–400)
RBC: 3.75 MIL/uL — AB (ref 3.87–5.11)
RDW: 13.4 % (ref 11.5–15.5)
WBC: 11.6 10*3/uL — ABNORMAL HIGH (ref 4.0–10.5)

## 2014-06-21 LAB — BASIC METABOLIC PANEL
ANION GAP: 6 (ref 5–15)
BUN: 13 mg/dL (ref 6–20)
CALCIUM: 9.2 mg/dL (ref 8.9–10.3)
CO2: 24 mmol/L (ref 22–32)
Chloride: 109 mmol/L (ref 101–111)
Creatinine, Ser: 0.66 mg/dL (ref 0.44–1.00)
Glucose, Bld: 140 mg/dL — ABNORMAL HIGH (ref 70–99)
POTASSIUM: 3.9 mmol/L (ref 3.5–5.1)
SODIUM: 139 mmol/L (ref 135–145)

## 2014-06-21 LAB — SAMPLE TO BLOOD BANK

## 2014-06-21 LAB — PROTIME-INR
INR: 1.03 (ref 0.00–1.49)
PROTHROMBIN TIME: 13.6 s (ref 11.6–15.2)

## 2014-06-21 LAB — CBG MONITORING, ED: GLUCOSE-CAPILLARY: 109 mg/dL — AB (ref 70–99)

## 2014-06-21 MED ORDER — SODIUM CHLORIDE 0.9 % IV SOLN
1000.0000 mL | INTRAVENOUS | Status: DC
  Administered 2014-06-21: 1000 mL via INTRAVENOUS

## 2014-06-21 MED ORDER — PROPOFOL 10 MG/ML IV BOLUS
40.0000 mg | INTRAVENOUS | Status: AC | PRN
  Administered 2014-06-21 (×3): 40 mg via INTRAVENOUS
  Filled 2014-06-21: qty 1

## 2014-06-21 MED ORDER — FENTANYL CITRATE (PF) 100 MCG/2ML IJ SOLN
50.0000 ug | INTRAMUSCULAR | Status: AC | PRN
Start: 2014-06-21 — End: 2014-06-21
  Administered 2014-06-21 (×2): 50 ug via INTRAVENOUS
  Filled 2014-06-21 (×2): qty 2

## 2014-06-21 NOTE — ED Notes (Signed)
Patient is resting quietly with no distress. Pt was talking with family in a normal tone and calm manner when entering the room.

## 2014-06-21 NOTE — ED Provider Notes (Signed)
CSN: [id number]     Arrival date & time 06/21/14  2030 History   First MD Initiated Contact with Patient 06/21/14 2040     Chief Complaint  Patient presents with  . Fall  . Hip Pain    Left Side   HPI Patient presents to the emergency room with complaints of left hip injury. The patient was standing up when she stumbled and fell onto carpeted floor. Patient is pain in her left hip and is unable to stand. EMS was called and they noted that her hip was internally rotated. Patient does have a history of a left hip arthroplasty performed by Dr. Alvan Dame in December of this past year. Patient denies any other injuries. She did not hit her head. No loss of consciousness. No neck pain. Past Medical History  Diagnosis Date  . Hypertension   . Encephalopathy   . Urine retention   . Insomnia    Past Surgical History  Procedure Laterality Date  . Cholecystectomy    . Hip arthroplasty Left 02/11/2014    Procedure: ARTHROPLASTY BIPOLAR HIP;  Surgeon: Mauri Pole, MD;  Location: [facility];  Service: Orthopedics;  Laterality: Left;  . Ankle surgery      Left   Family History  Problem Relation Age of Onset  . Alzheimer's disease Mother   . Heart disease Father    History  Substance Use Topics  . Smoking status: Never Smoker   . Smokeless tobacco: Never Used  . Alcohol Use: No   OB History    No data available     Review of Systems  All other systems reviewed and are negative.     Allergies  Morphine and related; Oxycontin; and Percocet  Home Medications   Prior to Admission medications   Medication Sig Start Date End Date Taking? Authorizing Provider  baclofen (LIORESAL) 10 MG tablet Take 10 mg by mouth 3 (three) times daily as needed for muscle spasms.  03/18/14  Yes Historical Provider, MD  naproxen (NAPROSYN) 500 MG tablet Take 1 tablet by mouth 2 (two) times daily. 05/20/14  Yes Historical Provider, MD  amLODipine (NORVASC) 10 MG tablet Take 1 tablet (10 mg total) by mouth daily.  02/16/14   Thurnell Lose, MD  aspirin EC 325 MG tablet Take 1 tablet (325 mg total) by mouth 2 (two) times daily. Take for 4 weeks Patient not taking: Reported on 06/21/2014 02/11/14   Paralee Cancel, MD  carvedilol (COREG) 12.5 MG tablet Take 1 tablet (12.5 mg total) by mouth 2 (two) times daily with a meal. Patient taking differently: Take 12.5 mg by mouth daily.  02/16/14   Thurnell Lose, MD  donepezil (ARICEPT) 5 MG tablet Take 1 tablet (5 mg total) by mouth at bedtime. 02/16/14   Thurnell Lose, MD  hydrALAZINE (APRESOLINE) 25 MG tablet Take 1 tablet (25 mg total) by mouth every 8 (eight) hours. Patient taking differently: Take 25 mg by mouth 2 (two) times daily. 0:00, 08:00, 16:00 02/16/14   Thurnell Lose, MD   BP 162/75 mmHg  Pulse 81  Temp(Src) 97.7 F (36.5 C) (Oral)  Resp 16  Ht 5\' 8"  (1.727 m)  Wt 165 lb (74.844 kg)  BMI 25.09 kg/m2  SpO2 94% Physical Exam  Constitutional: She appears well-developed and well-nourished. No distress.  HENT:  Head: Normocephalic and atraumatic.  Right Ear: External ear normal.  Left Ear: External ear normal.  Eyes: Conjunctivae are normal. Right eye exhibits no discharge. Left  eye exhibits no discharge. No scleral icterus.  Neck: Neck supple. No tracheal deviation present.  Cardiovascular: Normal rate, regular rhythm and intact distal pulses.   Pulmonary/Chest: Effort normal and breath sounds normal. No stridor. No respiratory distress. She has no wheezes. She has no rales.  Abdominal: Soft. Bowel sounds are normal. She exhibits no distension. There is no tenderness. There is no rebound and no guarding.  Musculoskeletal: She exhibits edema (edema bilateral lower extremities).       Left hip: She exhibits decreased range of motion, tenderness, bony tenderness and deformity.  Left lower extremity is internally rotated and shortened, limited range of motion of the left hip  Neurological: She is alert. She has normal strength. No cranial  nerve deficit (no facial droop, extraocular movements intact, no slurred speech) or sensory deficit. She exhibits normal muscle tone. She displays no seizure activity. Coordination normal.  Skin: Skin is warm and dry. No rash noted.  Psychiatric: She has a normal mood and affect.  Nursing note and vitals reviewed.   ED Course  Procedural sedation Date/Time: 06/21/2014 11:15 PM Performed by: Dorie Rank Authorized by: Dorie Rank Consent: Verbal consent obtained. Written consent obtained. Risks and benefits: risks, benefits and alternatives were discussed Time out: Immediately prior to procedure a "time out" was called to verify the correct patient, procedure, equipment, support staff and site/side marked as required. Patient sedated: yes Sedatives: propofol and see MAR for details Sedation end date/time: 06/22/2014 12:15 AM Vitals: Vital signs were monitored during sedation. Patient tolerance: Patient tolerated the procedure well with no immediate complications  Reduction of dislocation Date/Time: 06/21/2014 11:15 PM Performed by: Dorie Rank Authorized by: Dorie Rank Consent: Verbal consent obtained. Written consent obtained. Risks and benefits: risks, benefits and alternatives were discussed Consent given by: patient Time out: Immediately prior to procedure a "time out" was called to verify the correct patient, procedure, equipment, support staff and site/side marked as required. Patient tolerance: Patient tolerated the procedure well with no immediate complications Comments: Manual reduction of hip dislocation with traction.  Carma Leaven technique   (including critical care time) Labs Review Labs Reviewed  CBC WITH DIFFERENTIAL/PLATELET - Abnormal; Notable for the following:    WBC 11.6 (*)    RBC 3.75 (*)    Hemoglobin 11.2 (*)    HCT 34.8 (*)    Neutrophils Relative % 83 (*)    Neutro Abs 9.8 (*)    Lymphocytes Relative 8 (*)    All other components within normal limits  CBG  MONITORING, ED - Abnormal; Notable for the following:    Glucose-Capillary 109 (*)    All other components within normal limits  PROTIME-INR  BASIC METABOLIC PANEL  SAMPLE TO BLOOD BANK    Imaging Review Dg Hip Unilat With Pelvis 2-3 Views Left  06/21/2014   CLINICAL DATA:  Fell from standing position to carpeted floor today  EXAM: LEFT HIP (WITH PELVIS) 2-3 VIEWS  COMPARISON:  None.  FINDINGS: There is a left femoral endoprosthesis. There is superolateral dislocation of the left hip. No fracture is evident.  IMPRESSION: Left hip dislocation   Electronically Signed   By: Andreas Newport M.D.   On: 06/21/2014 22:15     MDM   Final diagnoses:  Hip pain  Hip dislocation, left, initial encounter    Patient's hip x-ray confirms the suspicion of a left hip dislocation. I will consult with her orthopedic surgeon, Dr. Alvan Dame. Patient will need sedation and reduction.  Pt tolerated the procedure  well.  Successful reduction.  Dc home with knee imobilizer.    Dorie Rank, MD 06/22/14 [phone]

## 2014-06-21 NOTE — ED Notes (Signed)
Patient is from home. Per EMS, patient fell in living room from standing position to a carpeted floor. Denies hitting her head, LOC, or dizziness. Complains left hip pain. Hip is rotated. Pulse present in left foot. Patient had an initial blood sugar of 53, EMS gave oral glucose at 20:12. Blood sugar increased to 68 at 20:27. Pt is alert, oriented x 4.

## 2014-06-21 NOTE — ED Notes (Signed)
Bed: YY48 Expected date: 06/21/14 Expected time: 8:09 PM Means of arrival: Ambulance Comments: 74 yo F  Fall, Hip pain

## 2014-06-22 MED ORDER — HYDROCODONE-ACETAMINOPHEN 5-325 MG PO TABS: 1.0000 | ORAL_TABLET | ORAL | Status: DC | PRN

## 2014-06-22 MED ORDER — ONDANSETRON HCL 4 MG PO TABS: 4.0000 mg | ORAL_TABLET | Freq: Four times a day (QID) | ORAL | Status: DC

## 2014-06-22 NOTE — Discharge Instructions (Signed)
Hip Dislocation °Hip dislocation is the displacement of the "ball" at the head of your thigh bone (femur) from its socket in the hip bone (pelvis). The ball-and-socket structure of the hip joint gives it a lot of stability, while allowing it to move freely. Therefore, a lot of force is required to displace the femur from its socket. A hip dislocation is an emergency. If you believe you have dislocated your hip and cannot move your leg, call for help immediately. Do not try to move. °CAUSES °The most common cause of hip dislocation is motor vehicle accidents. However, force from falls from a height (a ladder or building), injuries from contact sports, or injuries from industrial accidents can be enough to dislocate your hip. °SYMPTOMS °A hip dislocation is very painful. If you have a dislocated hip, you will not be able to move your hip. If you have nerve damage, you may not have feeling in your lower leg, foot, or ankle.  °DIAGNOSIS °Usually, your caregiver can diagnose a hip dislocation by looking at the position of your leg. Generally, X-ray exams are done to check for fractures in your femur or pelvis. The leg of the dislocated hip will appear shorter than the other leg, and your foot will be turned inward. °TREATMENT  °Your caregiver can manipulate your bones back into the joint (reduction). If there are no other complications involved with your dislocation, such as fractures or damage to blood vessels or nerves, this procedure can be done without surgery. Before this procedure, you will be given medicine so that you will not feel pain (anesthetic). Often specialized imaging exams are done after the reduction (magnetic resonance imaging [MRI] or computed tomography [CT]) to check for loose pieces of cartilage or bone in the joint. °If a manual reduction fails or you have nerve damage, damage to your blood vessels, or bone fractures, surgery will be necessary to perform the reduction.  °HOME CARE  INSTRUCTIONS °The following measures can help to reduce pain and speed up the healing process: °· Rest your injured joint. Do not move your joint if it is painful. Also, avoid activities similar to the one that caused your injury. °· Apply ice to your injured joint for 1 to 2 days after your reduction or as directed by your caregiver. Applying ice helps to reduce inflammation and pain. °¨ Put ice in a plastic bag. °¨ Place a towel between your skin and the bag. °¨ Leave the ice on for 15 to 20 minutes at a time, every 2 hours while you are awake. °· Use crutches or a walker as directed by your caregiver. °· Exercise your hip and leg as directed by your caregiver. °· Take over-the-counter or prescription medicine for pain as directed by your caregiver. °SEEK IMMEDIATE MEDICAL CARE IF: °· Your pain becomes worse rather than better. °· You feel like your hip has become dislocated again. °MAKE SURE YOU: °· Understand these instructions. °· Will watch your condition. °· Will get help right away if you are not doing well or get worse. °Document Released: 11/01/2000 Document Revised: 05/01/2011 Document Reviewed: 07/07/2010 °ExitCare® Patient Information ©2015 [employer]. This information is not intended to replace advice given to you by your health care provider. Make sure you discuss any questions you have with your health care provider. ° °

## 2014-06-22 NOTE — Sedation Documentation (Signed)
Patient is sitting up on stretcher. Provided patient ice water.

## 2014-07-02 ENCOUNTER — Ambulatory Visit: Payer: Medicare Other | Admitting: Neurology

## 2014-07-03 ENCOUNTER — Telehealth: Payer: Self-pay | Admitting: *Deleted

## 2014-07-03 ENCOUNTER — Ambulatory Visit: Payer: Self-pay | Admitting: Neurology

## 2014-07-03 NOTE — Telephone Encounter (Signed)
Called morning of appt to say she was sick.

## 2014-07-28 ENCOUNTER — Ambulatory Visit: Payer: Medicare Other | Admitting: Neurology

## 2014-07-29 ENCOUNTER — Encounter: Payer: Self-pay | Admitting: Neurology

## 2015-04-26 ENCOUNTER — Emergency Department (HOSPITAL_COMMUNITY)
Admission: EM | Admit: 2015-04-26 | Discharge: 2015-04-26 | Disposition: A | Discharge status: Home / Self Care | Payer: Medicare Other | Attending: Emergency Medicine | Admitting: Emergency Medicine

## 2015-04-26 ENCOUNTER — Encounter (HOSPITAL_COMMUNITY): Payer: Self-pay | Admitting: Emergency Medicine

## 2015-04-26 DIAGNOSIS — Y9389 Activity, other specified: Secondary | ICD-10-CM | POA: Diagnosis not present

## 2015-04-26 DIAGNOSIS — G4700 Insomnia, unspecified: Secondary | ICD-10-CM | POA: Insufficient documentation

## 2015-04-26 DIAGNOSIS — Y92009 Unspecified place in unspecified non-institutional (private) residence as the place of occurrence of the external cause: Secondary | ICD-10-CM | POA: Insufficient documentation

## 2015-04-26 DIAGNOSIS — Z043 Encounter for examination and observation following other accident: Secondary | ICD-10-CM | POA: Insufficient documentation

## 2015-04-26 DIAGNOSIS — I10 Essential (primary) hypertension: Secondary | ICD-10-CM | POA: Insufficient documentation

## 2015-04-26 DIAGNOSIS — Z79899 Other long term (current) drug therapy: Secondary | ICD-10-CM | POA: Insufficient documentation

## 2015-04-26 DIAGNOSIS — W1839XA Other fall on same level, initial encounter: Secondary | ICD-10-CM | POA: Diagnosis not present

## 2015-04-26 DIAGNOSIS — W19XXXA Unspecified fall, initial encounter: Secondary | ICD-10-CM

## 2015-04-26 DIAGNOSIS — Z791 Long term (current) use of non-steroidal anti-inflammatories (NSAID): Secondary | ICD-10-CM | POA: Insufficient documentation

## 2015-04-26 DIAGNOSIS — R29898 Other symptoms and signs involving the musculoskeletal system: Secondary | ICD-10-CM

## 2015-04-26 DIAGNOSIS — Y998 Other external cause status: Secondary | ICD-10-CM | POA: Insufficient documentation

## 2015-04-26 DIAGNOSIS — R531 Weakness: Secondary | ICD-10-CM | POA: Diagnosis present

## 2015-04-26 LAB — I-STAT CHEM 8, ED
BUN: 18 mg/dL (ref 6–20)
CALCIUM ION: 1.13 mmol/L (ref 1.13–1.30)
Chloride: 105 mmol/L (ref 101–111)
Creatinine, Ser: 0.7 mg/dL (ref 0.44–1.00)
Glucose, Bld: 94 mg/dL (ref 65–99)
HCT: 38 % (ref 36.0–46.0)
HEMOGLOBIN: 12.9 g/dL (ref 12.0–15.0)
Potassium: 4.3 mmol/L (ref 3.5–5.1)
Sodium: 142 mmol/L (ref 135–145)
TCO2: 25 mmol/L (ref 0–100)

## 2015-04-26 NOTE — ED Notes (Signed)
[name] called back and sts he will come pick pt up. Pt son questioning what to do from this point since she is so weak. [name] informed of d/c instructions and follow up with neurosurgeon to discuss outpatient physical therapy. [name] verbalized understanding.

## 2015-04-26 NOTE — ED Notes (Signed)
Bed: WA17 Expected date:  Expected time:  Means of arrival:  Comments: EMS bilateral lower ext pain

## 2015-04-26 NOTE — ED Notes (Addendum)
Patient here from home with complaints bilateral leg weakness. Hx of chronic pain. Ambulatory with walker.

## 2015-04-26 NOTE — ED Notes (Signed)
[name] called for transport. Unable to get in touch at this time. Voicemail left.

## 2015-04-26 NOTE — Progress Notes (Addendum)
ED Cm spoke with pt's son at [phone]  He and pt agree to home health RN, PT/aide and SW via [facility]  Spoke with [facility] staff Santiago Glad and EDP [place] requested Monique Rush be contacted vs pt PMH memory issues  Monique Rush son and granddaughter will be care givers Pt is not needing DME pcp osei bonsu sent EP[facility]  in basket message about orders for home health written CM left her mobile # for questions Monique Rush emailed advanced contact information and PDN to [email or web address]

## 2015-04-26 NOTE — Discharge Instructions (Signed)
We saw you in the ER after the fall. All the results in the ER are normal, and no imaging was ordered as we dont suspect fractures. We are not sure what is causing your symptoms. The workup in the ER is not complete, and is limited to screening for life threatening and emergent conditions only, so please see a primary care doctor for further evaluation.  Fall Prevention in the Home  Falls can cause injuries and can affect people from all age groups. There are many simple things that you can do to make your home safe and to help prevent falls. WHAT CAN I DO ON THE OUTSIDE OF MY HOME?  Regularly repair the edges of walkways and driveways and fix any cracks.  Remove high doorway thresholds.  Trim any shrubbery on the main path into your home.  Use bright outdoor lighting.  Clear walkways of debris and clutter, including tools and rocks.  Regularly check that handrails are securely fastened and in good repair. Both sides of any steps should have handrails.  Install guardrails along the edges of any raised decks or porches.  Have leaves, snow, and ice cleared regularly.  Use sand or salt on walkways during winter months.  In the garage, clean up any spills right away, including grease or oil spills. WHAT CAN I DO IN THE BATHROOM?  Use night lights.  Install grab bars by the toilet and in the tub and shower. Do not use towel bars as grab bars.  Use non-skid mats or decals on the floor of the tub or shower.  If you need to sit down while you are in the shower, use a plastic, non-slip stool.Marland Kitchen  Keep the floor dry. Immediately clean up any water that spills on the floor.  Remove soap buildup in the tub or shower on a regular basis.  Attach bath mats securely with double-sided non-slip rug tape.  Remove throw rugs and other tripping hazards from the floor. WHAT CAN I DO IN THE BEDROOM?  Use night lights.  Make sure that a bedside light is easy to reach.  Do not use oversized  bedding that drapes onto the floor.  Have a firm chair that has side arms to use for getting dressed.  Remove throw rugs and other tripping hazards from the floor. WHAT CAN I DO IN THE KITCHEN?   Clean up any spills right away.  Avoid walking on wet floors.  Place frequently used items in easy-to-reach places.  If you need to reach for something above you, use a sturdy step stool that has a grab bar.  Keep electrical cables out of the way.  Do not use floor polish or wax that makes floors slippery. If you have to use wax, make sure that it is non-skid floor wax.  Remove throw rugs and other tripping hazards from the floor. WHAT CAN I DO IN THE STAIRWAYS?  Do not leave any items on the stairs.  Make sure that there are handrails on both sides of the stairs. Fix handrails that are broken or loose. Make sure that handrails are as long as the stairways.  Check any carpeting to make sure that it is firmly attached to the stairs. Fix any carpet that is loose or worn.  Avoid having throw rugs at the top or bottom of stairways, or secure the rugs with carpet tape to prevent them from moving.  Make sure that you have a light switch at the top of the stairs and the  bottom of the stairs. If you do not have them, have them installed. WHAT ARE SOME OTHER FALL PREVENTION TIPS?  Wear closed-toe shoes that fit well and support your feet. Wear shoes that have rubber soles or low heels.  When you use a stepladder, make sure that it is completely opened and that the sides are firmly locked. Have someone hold the ladder while you are using it. Do not climb a closed stepladder.  Add color or contrast paint or tape to grab bars and handrails in your home. Place contrasting color strips on the first and last steps.  Use mobility aids as needed, such as canes, walkers, scooters, and crutches.  Turn on lights if it is dark. Replace any light bulbs that burn out.  Set up furniture so that there are  clear paths. Keep the furniture in the same spot.  Fix any uneven floor surfaces.  Choose a carpet design that does not hide the edge of steps of a stairway.  Be aware of any and all pets.  Review your medicines with your healthcare provider. Some medicines can cause dizziness or changes in blood pressure, which increase your risk of falling. Talk with your health care provider about other ways that you can decrease your risk of falls. This may include working with a physical therapist or trainer to improve your strength, balance, and endurance.   This information is not intended to replace advice given to you by your health care provider. Make sure you discuss any questions you have with your health care provider.   Document Released: 01/27/2002 Document Revised: 06/23/2014 Document Reviewed: 03/13/2014 Elsevier Interactive Patient Education [employer].

## 2015-04-26 NOTE — ED Notes (Signed)
[name] still has not arrived to pick up patient. This RN called son back and got voicemail. Voicemail left asking son to call back about when he would be at the hospital to take pt home. Pt getting frustrated that son is not here yet. Pt informed he has been called multiple times.

## 2015-04-26 NOTE — ED Provider Notes (Signed)
CSN: [id number]     Arrival date & time 04/26/15  [id number] History   First MD Initiated Contact with Patient 04/26/15 0710     Chief Complaint  Patient presents with  . Extremity Weakness     (Consider location/radiation/quality/duration/timing/severity/associated sxs/prior Treatment) HPI Comments: 75 y/o female with h/o gait disorder and difficulty walking. Pt has hx hx of PRES, and has see PM&R in the past to get better. She reports that over the last few months, she has noted increased difficulty in walking again. Her RLE, which has needed operation before, has been giving her problems again and the leg will often give out. She fell today,and EMS was called. PT lives by herself. She denies any pain from the fall. She didn't strike her head, and denies nausea, vomiting, visual complains, seizures, altered mental status, loss of consciousness, new numbness. Pt has ambulated since the fall in the ER. Denies hip pain. Not on any anticoagulant. Pt denies nausea, emesis, fevers, chills, chest pains, shortness of breath, headaches, abdominal pain, uti like symptoms.    Patient is a 75 y.o. female presenting with extremity weakness. The history is provided by the patient.  Extremity Weakness    Past Medical History  Diagnosis Date  . Hypertension   . Encephalopathy   . Urine retention   . Insomnia    Past Surgical History  Procedure Laterality Date  . Cholecystectomy    . Hip arthroplasty Left 02/11/2014    Procedure: ARTHROPLASTY BIPOLAR HIP;  Surgeon: Mauri Pole, MD;  Location: [facility];  Service: Orthopedics;  Laterality: Left;  . Ankle surgery      Left   Family History  Problem Relation Age of Onset  . Alzheimer's disease Mother   . Heart disease Father    Social History  Substance Use Topics  . Smoking status: Never Smoker   . Smokeless tobacco: Never Used  . Alcohol Use: No   OB History    No data available     Review of Systems  Musculoskeletal: Positive for  extremity weakness.  All other systems reviewed and are negative.     Allergies  Morphine and related; Oxycontin; and Percocet  Home Medications   Prior to Admission medications   Medication Sig Start Date End Date Taking? Authorizing Provider  amLODipine (NORVASC) 10 MG tablet Take 1 tablet (10 mg total) by mouth daily. Patient not taking: Reported on 06/22/2014 02/16/14   Thurnell Lose, MD  aspirin EC 325 MG tablet Take 1 tablet (325 mg total) by mouth 2 (two) times daily. Take for 4 weeks Patient not taking: Reported on 06/21/2014 02/11/14   Paralee Cancel, MD  baclofen (LIORESAL) 10 MG tablet Take 10 mg by mouth 3 (three) times daily as needed for muscle spasms.  03/18/14   Historical Provider, MD  buPROPion (WELLBUTRIN XL) 150 MG 24 hr tablet Take 150 mg by mouth daily.    Historical Provider, MD  carvedilol (COREG) 12.5 MG tablet Take 1 tablet (12.5 mg total) by mouth 2 (two) times daily with a meal. Patient taking differently: Take 12.5 mg by mouth daily.  02/16/14   Thurnell Lose, MD  donepezil (ARICEPT) 5 MG tablet Take 1 tablet (5 mg total) by mouth at bedtime. 02/16/14   Thurnell Lose, MD  etodolac (LODINE) 400 MG tablet Take 400 mg by mouth 2 (two) times daily.    Historical Provider, MD  hydrALAZINE (APRESOLINE) 25 MG tablet Take 1 tablet (25 mg total) by mouth  every 8 (eight) hours. Patient taking differently: Take 25 mg by mouth 2 (two) times daily. 0:00, 08:00, 16:00 02/16/14   Thurnell Lose, MD  HYDROcodone-acetaminophen (NORCO/VICODIN) 5-325 MG per tablet Take 1-2 tablets by mouth every 4 (four) hours as needed. 06/22/14   Dorie Rank, MD  metoprolol succinate (TOPROL-XL) 50 MG 24 hr tablet Take 50 mg by mouth daily. Take with or immediately following a meal.    Historical Provider, MD  naproxen (NAPROSYN) 500 MG tablet Take 1 tablet by mouth 2 (two) times daily. 05/20/14   Historical Provider, MD  ondansetron (ZOFRAN) 4 MG tablet Take 1 tablet (4 mg total) by mouth  every 6 (six) hours. 06/22/14   Dorie Rank, MD  QUEtiapine (SEROQUEL) 100 MG tablet Take 100 mg by mouth at bedtime as needed. For sleep    Historical Provider, MD   BP 161/78 mmHg  Pulse 86  Temp(Src) 98 F (36.7 C) (Oral)  Resp 16  SpO2 100% Physical Exam  Constitutional: She is oriented to person, place, and time. She appears well-developed and well-nourished.  HENT:  Head: Normocephalic and atraumatic.  Eyes: EOM are normal. Pupils are equal, round, and reactive to light.  Neck: Neck supple.  Cardiovascular: Normal rate, regular rhythm and normal heart sounds.   No murmur heard. Pulmonary/Chest: Effort normal. No respiratory distress.  Abdominal: Soft. She exhibits no distension. There is no tenderness. There is no rebound and no guarding.  Musculoskeletal: She exhibits no tenderness.  Head to toe evaluation shows no hematoma, bleeding of the scalp, no facial abrasions, step offs, crepitus, no tenderness to palpation of the bilateral upper and lower extremities, no gross deformities, no chest tenderness, no pelvic pain.  SHE HAS VALGUS STRESS ON THE R SIDE  Neurological: She is alert and oriented to person, place, and time.  Skin: Skin is warm and dry.  Nursing note and vitals reviewed.   ED Course  Procedures (including critical care time) Labs Review Labs Reviewed  I-STAT CHEM 8, ED    Imaging Review No results found. I have personally reviewed and evaluated these images and lab results as part of my medical decision-making.   EKG Interpretation None      MDM   Final diagnoses:  Transient left leg weakness  Fall, initial encounter    DDx includes: - Mechanical falls - ICH - Fractures - Contusions - Soft tissue injury  Pt comes in post fall. On exam, she has no signs of trauma. She denies striking her head, is alert and reliable with history, and has no red flags for severe head trauma/bleed. We will not get CT head. Cspine cleared clinically.  Fall - hx  suggests PRES, encephalopathy and gait problems in the past. Seems like her chronic problems had improved, but gotten worse again. We will get basic labs. She has been advised to see her pcp/neuro - might need pm&r referral or PT/OT.  Pt is comfortable with the plan. She has ambulated in the ER w/o assistance.     Varney Biles, MD 04/26/15 [phone]

## 2016-01-06 ENCOUNTER — Other Ambulatory Visit (INDEPENDENT_AMBULATORY_CARE_PROVIDER_SITE_OTHER): Payer: Self-pay | Admitting: Specialist

## 2016-01-06 NOTE — Telephone Encounter (Signed)
Ok to fill 

## 2016-02-06 ENCOUNTER — Other Ambulatory Visit (INDEPENDENT_AMBULATORY_CARE_PROVIDER_SITE_OTHER): Payer: Self-pay | Admitting: Specialist

## 2016-02-07 NOTE — Telephone Encounter (Signed)
Rx request, pls advise.  

## 2016-03-16 ENCOUNTER — Other Ambulatory Visit (INDEPENDENT_AMBULATORY_CARE_PROVIDER_SITE_OTHER): Payer: Self-pay | Admitting: Specialist

## 2016-09-20 ENCOUNTER — Other Ambulatory Visit (INDEPENDENT_AMBULATORY_CARE_PROVIDER_SITE_OTHER): Payer: Self-pay | Admitting: Specialist

## 2016-09-20 NOTE — Telephone Encounter (Signed)
Naproxen Refill request 

## 2016-10-19 ENCOUNTER — Other Ambulatory Visit (INDEPENDENT_AMBULATORY_CARE_PROVIDER_SITE_OTHER): Payer: Self-pay | Admitting: Specialist

## 2016-10-20 NOTE — Telephone Encounter (Signed)
Naproxen refill request 

## 2016-10-27 ENCOUNTER — Other Ambulatory Visit (INDEPENDENT_AMBULATORY_CARE_PROVIDER_SITE_OTHER): Payer: Self-pay | Admitting: Radiology

## 2016-10-27 MED ORDER — NAPROXEN 500 MG PO TABS: 500.0000 mg | ORAL_TABLET | Freq: Two times a day (BID) | ORAL | 0 refills | Status: DC

## 2016-11-22 ENCOUNTER — Other Ambulatory Visit (INDEPENDENT_AMBULATORY_CARE_PROVIDER_SITE_OTHER): Payer: Self-pay | Admitting: Specialist

## 2016-11-22 NOTE — Telephone Encounter (Signed)
Naproxen Refill Request

## 2017-09-25 ENCOUNTER — Encounter (HOSPITAL_COMMUNITY): Payer: Self-pay | Admitting: Emergency Medicine

## 2017-09-25 ENCOUNTER — Other Ambulatory Visit: Payer: Self-pay

## 2017-09-25 ENCOUNTER — Emergency Department (HOSPITAL_COMMUNITY): Payer: Medicare Other

## 2017-09-25 ENCOUNTER — Inpatient Hospital Stay (HOSPITAL_COMMUNITY)
Admission: EM | Admit: 2017-09-25 | Discharge: 2017-10-01 | DRG: 689 | Disposition: A | Discharge status: Skilled Nursing Facility | Payer: Medicare Other | Attending: Family Medicine | Admitting: Family Medicine

## 2017-09-25 DIAGNOSIS — M549 Dorsalgia, unspecified: Secondary | ICD-10-CM

## 2017-09-25 DIAGNOSIS — Z96642 Presence of left artificial hip joint: Secondary | ICD-10-CM | POA: Diagnosis present

## 2017-09-25 DIAGNOSIS — S22000A Wedge compression fracture of unspecified thoracic vertebra, initial encounter for closed fracture: Secondary | ICD-10-CM

## 2017-09-25 DIAGNOSIS — N390 Urinary tract infection, site not specified: Principal | ICD-10-CM | POA: Diagnosis present

## 2017-09-25 DIAGNOSIS — F329 Major depressive disorder, single episode, unspecified: Secondary | ICD-10-CM | POA: Diagnosis present

## 2017-09-25 DIAGNOSIS — G4700 Insomnia, unspecified: Secondary | ICD-10-CM | POA: Diagnosis present

## 2017-09-25 DIAGNOSIS — Z820 Family history of epilepsy and other diseases of the nervous system: Secondary | ICD-10-CM

## 2017-09-25 DIAGNOSIS — Z8249 Family history of ischemic heart disease and other diseases of the circulatory system: Secondary | ICD-10-CM

## 2017-09-25 DIAGNOSIS — R410 Disorientation, unspecified: Secondary | ICD-10-CM

## 2017-09-25 DIAGNOSIS — R748 Abnormal levels of other serum enzymes: Secondary | ICD-10-CM | POA: Diagnosis present

## 2017-09-25 DIAGNOSIS — R600 Localized edema: Secondary | ICD-10-CM | POA: Diagnosis present

## 2017-09-25 DIAGNOSIS — R740 Nonspecific elevation of levels of transaminase and lactic acid dehydrogenase [LDH]: Secondary | ICD-10-CM | POA: Diagnosis present

## 2017-09-25 DIAGNOSIS — G9341 Metabolic encephalopathy: Secondary | ICD-10-CM | POA: Diagnosis present

## 2017-09-25 DIAGNOSIS — M4854XA Collapsed vertebra, not elsewhere classified, thoracic region, initial encounter for fracture: Secondary | ICD-10-CM | POA: Diagnosis present

## 2017-09-25 DIAGNOSIS — D649 Anemia, unspecified: Secondary | ICD-10-CM | POA: Diagnosis present

## 2017-09-25 DIAGNOSIS — F0390 Unspecified dementia without behavioral disturbance: Secondary | ICD-10-CM | POA: Diagnosis present

## 2017-09-25 DIAGNOSIS — R000 Tachycardia, unspecified: Secondary | ICD-10-CM | POA: Diagnosis present

## 2017-09-25 DIAGNOSIS — I10 Essential (primary) hypertension: Secondary | ICD-10-CM | POA: Diagnosis present

## 2017-09-25 DIAGNOSIS — E876 Hypokalemia: Secondary | ICD-10-CM | POA: Diagnosis present

## 2017-09-25 LAB — URINALYSIS, ROUTINE W REFLEX MICROSCOPIC
BILIRUBIN URINE: NEGATIVE
Glucose, UA: NEGATIVE mg/dL
Ketones, ur: NEGATIVE mg/dL
NITRITE: NEGATIVE
PROTEIN: NEGATIVE mg/dL
Specific Gravity, Urine: 1.013 (ref 1.005–1.030)
pH: 6 (ref 5.0–8.0)

## 2017-09-25 LAB — CBC
HCT: 31.9 % — ABNORMAL LOW (ref 36.0–46.0)
Hemoglobin: 10.1 g/dL — ABNORMAL LOW (ref 12.0–15.0)
MCH: 27.6 pg (ref 26.0–34.0)
MCHC: 31.7 g/dL (ref 30.0–36.0)
MCV: 87.2 fL (ref 78.0–100.0)
PLATELETS: 192 10*3/uL (ref 150–400)
RBC: 3.66 MIL/uL — ABNORMAL LOW (ref 3.87–5.11)
RDW: 15.5 % (ref 11.5–15.5)
WBC: 10.1 10*3/uL (ref 4.0–10.5)

## 2017-09-25 LAB — COMPREHENSIVE METABOLIC PANEL
ALBUMIN: 3.4 g/dL — AB (ref 3.5–5.0)
ALT: 21 U/L (ref 0–44)
ANION GAP: 14 (ref 5–15)
AST: 84 U/L — ABNORMAL HIGH (ref 15–41)
Alkaline Phosphatase: 120 U/L (ref 38–126)
BUN: 20 mg/dL (ref 8–23)
CO2: 31 mmol/L (ref 22–32)
Calcium: 12.2 mg/dL — ABNORMAL HIGH (ref 8.9–10.3)
Chloride: 101 mmol/L (ref 98–111)
Creatinine, Ser: 1.01 mg/dL — ABNORMAL HIGH (ref 0.44–1.00)
GFR calc non Af Amer: 52 mL/min — ABNORMAL LOW (ref >60)
GLUCOSE: 131 mg/dL — AB (ref 70–99)
POTASSIUM: 2.7 mmol/L — AB (ref 3.5–5.1)
SODIUM: 146 mmol/L — AB (ref 135–145)
Total Bilirubin: 0.9 mg/dL (ref 0.3–1.2)
Total Protein: 6.9 g/dL (ref 6.5–8.1)

## 2017-09-25 LAB — CBC WITH DIFFERENTIAL/PLATELET
BASOS PCT: 1 %
Basophils Absolute: 0.1 10*3/uL (ref 0.0–0.1)
Eosinophils Absolute: 0.1 10*3/uL (ref 0.0–0.7)
Eosinophils Relative: 2 %
HEMATOCRIT: 30.4 % — AB (ref 36.0–46.0)
HEMOGLOBIN: 9.7 g/dL — AB (ref 12.0–15.0)
LYMPHS ABS: 1.8 10*3/uL (ref 0.7–4.0)
Lymphocytes Relative: 22 %
MCH: 27.9 pg (ref 26.0–34.0)
MCHC: 31.9 g/dL (ref 30.0–36.0)
MCV: 87.4 fL (ref 78.0–100.0)
MONOS PCT: 6 %
Monocytes Absolute: 0.5 10*3/uL (ref 0.1–1.0)
NEUTROS ABS: 5.7 10*3/uL (ref 1.7–7.7)
NEUTROS PCT: 69 %
Platelets: 197 10*3/uL (ref 150–400)
RBC: 3.48 MIL/uL — AB (ref 3.87–5.11)
RDW: 15.4 % (ref 11.5–15.5)
WBC: 8.2 10*3/uL (ref 4.0–10.5)

## 2017-09-25 LAB — PROTIME-INR
INR: 0.99
PROTHROMBIN TIME: 13 s (ref 11.4–15.2)

## 2017-09-25 LAB — I-STAT CG4 LACTIC ACID, ED: Lactic Acid, Venous: 1.75 mmol/L (ref 0.5–1.9)

## 2017-09-25 LAB — CREATININE, SERUM
Creatinine, Ser: 0.89 mg/dL (ref 0.44–1.00)
GFR calc non Af Amer: >60 mL/min (ref >60)

## 2017-09-25 LAB — MAGNESIUM: Magnesium: 2.1 mg/dL (ref 1.7–2.4)

## 2017-09-25 LAB — TSH: TSH: 2.916 u[IU]/mL (ref 0.350–4.500)

## 2017-09-25 LAB — VITAMIN B12: Vitamin B-12: 554 pg/mL (ref 180–914)

## 2017-09-25 MED ORDER — SODIUM CHLORIDE 0.9 % IV SOLN: Freq: Once | INTRAVENOUS | Status: DC

## 2017-09-25 MED ORDER — POTASSIUM CHLORIDE CRYS ER 20 MEQ PO TBCR
40.0000 meq | EXTENDED_RELEASE_TABLET | ORAL | Status: AC
  Administered 2017-09-25 (×2): 40 meq via ORAL
  Filled 2017-09-25 (×3): qty 2

## 2017-09-25 MED ORDER — DICLOFENAC SODIUM 1 % TD GEL
2.0000 g | Freq: Four times a day (QID) | TRANSDERMAL | Status: DC
  Administered 2017-09-25 – 2017-10-01 (×23): 2 g via TOPICAL
  Filled 2017-09-25: qty 100

## 2017-09-25 MED ORDER — POTASSIUM CHLORIDE CRYS ER 20 MEQ PO TBCR
40.0000 meq | EXTENDED_RELEASE_TABLET | Freq: Once | ORAL | Status: AC
  Administered 2017-09-25: 40 meq via ORAL
  Filled 2017-09-25: qty 2

## 2017-09-25 MED ORDER — SODIUM CHLORIDE 0.9 % IV SOLN
1.0000 g | Freq: Once | INTRAVENOUS | Status: AC
  Administered 2017-09-25: 1 g via INTRAVENOUS
  Filled 2017-09-25: qty 10

## 2017-09-25 MED ORDER — SODIUM CHLORIDE 0.9 % IV BOLUS
1000.0000 mL | Freq: Once | INTRAVENOUS | Status: AC
  Administered 2017-09-25: 1000 mL via INTRAVENOUS

## 2017-09-25 MED ORDER — MIRTAZAPINE 15 MG PO TABS
30.0000 mg | ORAL_TABLET | Freq: Every day | ORAL | Status: DC
  Administered 2017-09-25 – 2017-09-30 (×6): 30 mg via ORAL
  Filled 2017-09-25 (×6): qty 2

## 2017-09-25 MED ORDER — AMLODIPINE BESYLATE 10 MG PO TABS
10.0000 mg | ORAL_TABLET | Freq: Every day | ORAL | Status: DC
  Administered 2017-09-25 – 2017-10-01 (×7): 10 mg via ORAL
  Filled 2017-09-25 (×7): qty 1

## 2017-09-25 MED ORDER — BUPROPION HCL ER (XL) 150 MG PO TB24
150.0000 mg | ORAL_TABLET | Freq: Every day | ORAL | Status: DC
  Administered 2017-09-25 – 2017-09-26 (×2): 150 mg via ORAL
  Filled 2017-09-25 (×2): qty 1

## 2017-09-25 MED ORDER — LACTATED RINGERS IV SOLN
INTRAVENOUS | Status: AC
  Administered 2017-09-25 (×2): via INTRAVENOUS

## 2017-09-25 MED ORDER — ACETAMINOPHEN 325 MG PO TABS
650.0000 mg | ORAL_TABLET | Freq: Four times a day (QID) | ORAL | Status: DC | PRN
  Administered 2017-09-25 – 2017-09-30 (×7): 650 mg via ORAL
  Filled 2017-09-25 (×7): qty 2

## 2017-09-25 MED ORDER — ENOXAPARIN SODIUM 40 MG/0.4ML ~~LOC~~ SOLN
40.0000 mg | SUBCUTANEOUS | Status: DC
  Administered 2017-09-25 – 2017-09-30 (×6): 40 mg via SUBCUTANEOUS
  Filled 2017-09-25 (×6): qty 0.4

## 2017-09-25 MED ORDER — METOPROLOL SUCCINATE ER 50 MG PO TB24
50.0000 mg | ORAL_TABLET | Freq: Every day | ORAL | Status: DC
  Administered 2017-09-26 – 2017-10-01 (×6): 50 mg via ORAL
  Filled 2017-09-25 (×6): qty 1

## 2017-09-25 MED ORDER — HYDROCHLOROTHIAZIDE 25 MG PO TABS
12.5000 mg | ORAL_TABLET | Freq: Every day | ORAL | Status: DC
  Administered 2017-09-25 – 2017-09-28 (×4): 12.5 mg via ORAL
  Filled 2017-09-25 (×4): qty 1

## 2017-09-25 MED ORDER — ASPIRIN EC 81 MG PO TBEC
81.0000 mg | DELAYED_RELEASE_TABLET | Freq: Every day | ORAL | Status: DC
  Administered 2017-09-25 – 2017-10-01 (×7): 81 mg via ORAL
  Filled 2017-09-25 (×7): qty 1

## 2017-09-25 MED ORDER — SODIUM CHLORIDE 0.9 % IV SOLN
1.0000 g | INTRAVENOUS | Status: DC
  Administered 2017-09-26 – 2017-10-01 (×6): 1 g via INTRAVENOUS
  Filled 2017-09-25 (×6): qty 1

## 2017-09-25 MED ORDER — QUETIAPINE FUMARATE 100 MG PO TABS
100.0000 mg | ORAL_TABLET | Freq: Every day | ORAL | Status: DC
  Administered 2017-09-25 – 2017-09-30 (×6): 100 mg via ORAL
  Filled 2017-09-25 (×6): qty 1

## 2017-09-25 MED ORDER — LACTATED RINGERS IV BOLUS
500.0000 mL | Freq: Once | INTRAVENOUS | Status: AC
  Administered 2017-09-25: 500 mL via INTRAVENOUS

## 2017-09-25 NOTE — ED Triage Notes (Signed)
Patient brought in by EMS from home.Patient is getting more confused x 1 month per daughter. Per report patient has been having fowl smelling urine. Patient is tachycardic and BP is high 172/101 for EMS. Patients blood sugar is 135 for EMS. Patient has new edema in bilateral feet.

## 2017-09-25 NOTE — ED Provider Notes (Signed)
[facility] DEPT Provider Note   CSN: [id number] Arrival date & time: 09/25/17  0910     History   Chief Complaint Chief Complaint  Patient presents with  . Dysuria    HPI Monique Rush is a 77 y.o. female.  Level 5 caveat for confusion.  Initial history obtained via daughter through nurse.  I did not actually speak to the daughter.  Patient has been more confused for the past month.  Additionally, she has foul-smelling urine.  No chest pain, dyspnea, fever, sweats, chills, stiff neck.       Past Medical History:  Diagnosis Date  . Encephalopathy   . Hypertension   . Insomnia   . Urine retention     Patient Active Problem List   Diagnosis Date Noted  . Fracture of femoral neck, left ([facility]) 02/12/2014  . PRES (posterior reversible encephalopathy syndrome) 02/11/2014  . Encephalopathy 02/11/2014  . Leukocytosis 02/11/2014  . Dehydration 02/11/2014    Past Surgical History:  Procedure Laterality Date  . ANKLE SURGERY     Left  . CHOLECYSTECTOMY    . HIP ARTHROPLASTY Left 02/11/2014   Procedure: ARTHROPLASTY BIPOLAR HIP;  Surgeon: Mauri Pole, MD;  Location: [facility];  Service: Orthopedics;  Laterality: Left;     OB History   None      Home Medications    Prior to Admission medications   Medication Sig Start Date End Date Taking? Authorizing Provider  amLODipine (NORVASC) 10 MG tablet Take 1 tablet (10 mg total) by mouth daily. 02/16/14  Yes Thurnell Lose, MD  aspirin EC 325 MG tablet Take 1 tablet (325 mg total) by mouth 2 (two) times daily. Take for 4 weeks 02/11/14  Yes Paralee Cancel, MD  baclofen (LIORESAL) 10 MG tablet Take 10 mg by mouth 3 (three) times daily as needed for muscle spasms.  03/18/14  Yes [provider]  buPROPion (WELLBUTRIN XL) 150 MG 24 hr tablet Take 150 mg by mouth daily.   Yes [provider]  carvedilol (COREG) 12.5 MG tablet Take 1 tablet (12.5 mg total) by mouth 2 (two) times  daily with a meal. Patient taking differently: Take 12.5 mg by mouth daily.  02/16/14  Yes Thurnell Lose, MD  dimenhyDRINATE (DRAMAMINE PO) Take 1 tablet by mouth daily as needed (dizziness).   Yes [provider]  donepezil (ARICEPT) 5 MG tablet Take 1 tablet (5 mg total) by mouth at bedtime. 02/16/14  Yes Thurnell Lose, MD  hydrALAZINE (APRESOLINE) 25 MG tablet Take 1 tablet (25 mg total) by mouth every 8 (eight) hours. Patient taking differently: Take 25 mg by mouth 2 (two) times daily. 0:00, 08:00, 16:00 02/16/14  Yes Thurnell Lose, MD  hydrochlorothiazide (HYDRODIURIL) 12.5 MG tablet Take 12.5 mg by mouth daily. 06/22/17  Yes [provider]  meloxicam (MOBIC) 7.5 MG tablet Take 7.5 mg by mouth daily. 07/09/17  Yes [provider]  metoprolol succinate (TOPROL-XL) 50 MG 24 hr tablet Take 50 mg by mouth daily. Take with or immediately following a meal.   Yes [provider]  mirtazapine (REMERON) 30 MG tablet Take 30 mg by mouth at bedtime. 09/10/17  Yes [provider]  QUEtiapine (SEROQUEL) 100 MG tablet Take 100 mg by mouth at bedtime as needed. For sleep   Yes [provider]  naproxen (NAPROSYN) 500 MG tablet Take 1 tablet (500 mg total) by mouth 2 (two) times daily with a meal. Patient  not taking: Reported on 09/25/2017 10/27/16   Jessy Oto, MD  naproxen (NAPROSYN) 500 MG tablet TAKE 1 TABLET BY MOUTH TWICE DAILY WITH FOOD Patient not taking: Reported on 09/25/2017 12/04/16   Jessy Oto, MD  ondansetron (ZOFRAN) 4 MG tablet Take 1 tablet (4 mg total) by mouth every 6 (six) hours. Patient not taking: Reported on 09/25/2017 06/22/14   Dorie Rank, MD    Family History Family History  Problem Relation Age of Onset  . Alzheimer's disease Mother   . Heart disease Father     Social History Social History   Tobacco Use  . Smoking status: Never Smoker  . Smokeless tobacco: Never Used  Substance Use Topics  . Alcohol use:  No    Alcohol/week: 0.0 oz  . Drug use: No     Allergies   Morphine and related; Oxycontin [oxycodone hcl]; and Percocet [oxycodone-acetaminophen]   Review of Systems Review of Systems  Unable to perform ROS: Mental status change     Physical Exam Updated Vital Signs BP (!) 153/100   Pulse (!) 126   Temp 98.2 F (36.8 C) (Oral)   Resp (!) 25   Wt 74.8 kg (165 lb)   SpO2 98%   BMI 25.09 kg/m   Physical Exam  Constitutional:  Conversant, slightly confused, nontoxic-appearing  HENT:  Head: Normocephalic and atraumatic.  Eyes: Conjunctivae are normal.  Neck: Neck supple.  Cardiovascular: Regular rhythm.  Tachycardic  Pulmonary/Chest: Effort normal and breath sounds normal.  Abdominal: Soft. Bowel sounds are normal.  Musculoskeletal: Normal range of motion.  Neurological: She is alert.  Skin: Skin is warm and dry.  Psychiatric:  Flat affect  Nursing note and vitals reviewed.    ED Treatments / Results  Labs (all labs ordered are listed, but only abnormal results are displayed) Labs Reviewed  COMPREHENSIVE METABOLIC PANEL - Abnormal; Notable for the following components:      Result Value   Sodium 146 (*)    Potassium 2.7 (*)    Glucose, Bld 131 (*)    Creatinine, Ser 1.01 (*)    Calcium 12.2 (*)    Albumin 3.4 (*)    AST 84 (*)    GFR calc non Af Amer 52 (*)    All other components within normal limits  CBC WITH DIFFERENTIAL/PLATELET - Abnormal; Notable for the following components:   RBC 3.48 (*)    Hemoglobin 9.7 (*)    HCT 30.4 (*)    All other components within normal limits  URINALYSIS, ROUTINE W REFLEX MICROSCOPIC - Abnormal; Notable for the following components:   Hgb urine dipstick SMALL (*)    Leukocytes, UA LARGE (*)    WBC, UA >50 (*)    Bacteria, UA RARE (*)    All other components within normal limits  CULTURE, BLOOD (ROUTINE X 2)  CULTURE, BLOOD (ROUTINE X 2)  URINE CULTURE  PROTIME-INR  I-STAT CG4 LACTIC ACID, ED    EKG EKG  Interpretation  Date/Time:  Tuesday September 25 2017 10:02:59 EDT Ventricular Rate:  120 PR Interval:    QRS Duration: 89 QT Interval:  326 QTC Calculation: 461 R Axis:   61 Text Interpretation:  Sinus tachycardia Inferior infarct, old Anterolateral infarct, age indeterminate Confirmed by Nat Christen [phone]) on 09/25/2017 1:42:30 PM   Radiology Dg Chest 2 View  Result Date: 09/25/2017 CLINICAL DATA:  Confusion EXAM: CHEST - 2 VIEW COMPARISON:  February 10, 2014 FINDINGS: There is no edema or consolidation. The  heart size and pulmonary vascularity are normal. No adenopathy. No evident bone lesions. There is aortic atherosclerosis. IMPRESSION: No edema or consolidation.  There is aortic atherosclerosis. Aortic Atherosclerosis (ICD10-I70.0). Electronically Signed   By: Lowella Grip III M.D.   On: 09/25/2017 12:19    Procedures Procedures (including critical care time)  Medications Ordered in ED Medications  sodium chloride 0.9 % bolus 1,000 mL ( Intravenous Stopped 09/25/17 1155)  cefTRIAXone (ROCEPHIN) 1 g in sodium chloride 0.9 % 100 mL IVPB (0 g Intravenous Stopped 09/25/17 1311)  potassium chloride SA (K-DUR,KLOR-CON) CR tablet 40 mEq (40 mEq Oral Given 09/25/17 1237)     Initial Impression / Assessment and Plan / ED Course  I have reviewed the triage vital signs and the nursing notes.  Pertinent labs & imaging results that were available during my care of the patient were reviewed by me and considered in my medical decision making (see chart for details).     Patient presents with confusion.  Unfortunately, there was no family member in the room.  Urinalysis shows evidence of infection.  CT head negative.  Potassium 2.7.  Will replace potassium, IV fluids, IV Rocephin, urine culture, admit to general medicine.  Blood pressure is remained stable.  Pulse slightly reduced.   CRITICAL CARE Performed by: Nat Christen Total critical care time: 30 minutes Critical care time was exclusive  of separately billable procedures and treating other patients. Critical care was necessary to treat or prevent imminent or life-threatening deterioration. Critical care was time spent personally by me on the following activities: development of treatment plan with patient and/or surrogate as well as nursing, discussions with consultants, evaluation of patient's response to treatment, examination of patient, obtaining history from patient or surrogate, ordering and performing treatments and interventions, ordering and review of laboratory studies, ordering and review of radiographic studies, pulse oximetry and re-evaluation of patient's condition.  Final Clinical Impressions(s) / ED Diagnoses   Final diagnoses:  Urinary tract infection without hematuria, site unspecified    ED Discharge Orders    None       Nat Christen, MD 09/25/17 1444

## 2017-09-25 NOTE — ED Notes (Signed)
ED TO INPATIENT HANDOFF REPORT  Name/Age/Gender Monique Rush 77 y.o. female  Code Status Code Status History    Date Active Date Inactive Code Status Order ID Comments User Context   02/11/2014 2021 02/16/2014 2132 Full Code [id number]  Mauri Pole, MD Inpatient   02/11/2014 0201 02/11/2014 2021 Full Code [id number]  Shanda Howells, MD ED      Home/SNF/Other Home  Chief Complaint AMS  Level of Care/Admitting Diagnosis ED Disposition    ED Disposition Condition [place]: [facility] [100102]  Level of Care: Telemetry [5]  Admit to tele based on following criteria: Complex arrhythmia (Bradycardia/Tachycardia)  Diagnosis: Acute metabolic encephalopathy [8144818]  Admitting Physician: Elodia Florence [phone]  Attending Physician: Cephus Slater, A CALDWELL [phone]  PT Class (Do Not Modify): Observation [104]  PT Acc Code (Do Not Modify): Observation [10022]       Medical History Past Medical History:  Diagnosis Date  . Encephalopathy   . Hypertension   . Insomnia   . Urine retention     Allergies Allergies  Allergen Reactions  . Morphine And Related Nausea And Vomiting  . Oxycontin [Oxycodone Hcl] Nausea And Vomiting  . Percocet [Oxycodone-Acetaminophen] Nausea And Vomiting    IV Location/Drains/Wounds Patient Lines/Drains/Airways Status   Active Line/Drains/Airways    Name:   Placement date:   Placement time:   Site:   Days:   Peripheral IV 09/25/17 Right Forearm   09/25/17    1017    Forearm   less than 1   Peripheral IV 09/25/17 Left Antecubital   09/25/17    1025    Antecubital   less than 1   Incision (Closed) 02/11/14 Leg Left   02/11/14    1731     1322          Labs/Imaging Results for orders placed or performed during the hospital encounter of 09/25/17 (from the past 48 hour(s))  Comprehensive metabolic panel     Status: Abnormal   Collection Time: 09/25/17  9:49 AM  Result Value Ref Range   Sodium 146 (H) 135 - 145 mmol/L   Potassium 2.7 (LL) 3.5 - 5.1 mmol/L    Comment: CRITICAL RESULT CALLED TO, READ BACK BY AND VERIFIED WITH: BRAWNER,A. RN AT 1059 09/25/17 MULLINS,T    Chloride 101 98 - 111 mmol/L   CO2 31 22 - 32 mmol/L   Glucose, Bld 131 (H) 70 - 99 mg/dL   BUN 20 8 - 23 mg/dL   Creatinine, Ser 1.01 (H) 0.44 - 1.00 mg/dL   Calcium 12.2 (H) 8.9 - 10.3 mg/dL   Total Protein 6.9 6.5 - 8.1 g/dL   Albumin 3.4 (L) 3.5 - 5.0 g/dL   AST 84 (H) 15 - 41 U/L   ALT 21 0 - 44 U/L   Alkaline Phosphatase 120 38 - 126 U/L   Total Bilirubin 0.9 0.3 - 1.2 mg/dL   GFR calc non Af Amer 52 (L) >60 mL/min   GFR calc Af Amer >60 >60 mL/min    Comment: (NOTE) The eGFR has been calculated using the CKD EPI equation. This calculation has not been validated in all clinical situations. eGFR's persistently <60 mL/min signify possible Chronic Kidney Disease.    Anion gap 14 5 - 15    Comment: Performed at [facility], [place] [address], [place], [place] [address]  CBC with Differential     Status: Abnormal   Collection Time:  09/25/17  9:49 AM  Result Value Ref Range   WBC 8.2 4.0 - 10.5 K/uL   RBC 3.48 (L) 3.87 - 5.11 MIL/uL   Hemoglobin 9.7 (L) 12.0 - 15.0 g/dL   HCT 30.4 (L) 36.0 - 46.0 %   MCV 87.4 78.0 - 100.0 fL   MCH 27.9 26.0 - 34.0 pg   MCHC 31.9 30.0 - 36.0 g/dL   RDW 15.4 11.5 - 15.5 %   Platelets 197 150 - 400 K/uL   Neutrophils Relative % 69 %   Neutro Abs 5.7 1.7 - 7.7 K/uL   Lymphocytes Relative 22 %   Lymphs Abs 1.8 0.7 - 4.0 K/uL   Monocytes Relative 6 %   Monocytes Absolute 0.5 0.1 - 1.0 K/uL   Eosinophils Relative 2 %   Eosinophils Absolute 0.1 0.0 - 0.7 K/uL   Basophils Relative 1 %   Basophils Absolute 0.1 0.0 - 0.1 K/uL    Comment: Performed at [facility], [place] [address], [place], [place] [address]  Protime-INR     Status: None   Collection Time: 09/25/17  9:49 AM  Result Value Ref Range   Prothrombin Time 13.0 11.4  - 15.2 seconds   INR 0.99     Comment: Performed at [facility], [place] [address], [place], [place] [address]  Urinalysis, Routine w reflex microscopic     Status: Abnormal   Collection Time: 09/25/17  9:49 AM  Result Value Ref Range   Color, Urine YELLOW YELLOW   APPearance CLEAR CLEAR   Specific Gravity, Urine 1.013 1.005 - 1.030   pH 6.0 5.0 - 8.0   Glucose, UA NEGATIVE NEGATIVE mg/dL   Hgb urine dipstick SMALL (A) NEGATIVE   Bilirubin Urine NEGATIVE NEGATIVE   Ketones, ur NEGATIVE NEGATIVE mg/dL   Protein, ur NEGATIVE NEGATIVE mg/dL   Nitrite NEGATIVE NEGATIVE   Leukocytes, UA LARGE (A) NEGATIVE   RBC / HPF 6-10 0 - 5 RBC/hpf   WBC, UA >50 (H) 0 - 5 WBC/hpf   Bacteria, UA RARE (A) NONE SEEN   Squamous Epithelial / LPF 0-5 0 - 5   Mucus PRESENT     Comment: Performed at [facility], [place] [address] [place], [place] [address]  I-Stat CG4 Lactic Acid, ED     Status: None   Collection Time: 09/25/17 10:48 AM  Result Value Ref Range   Lactic Acid, Venous 1.75 0.5 - 1.9 mmol/L   Dg Chest 2 View  Result Date: 09/25/2017 CLINICAL DATA:  Confusion EXAM: CHEST - 2 VIEW COMPARISON:  February 10, 2014 FINDINGS: There is no edema or consolidation. The heart size and pulmonary vascularity are normal. No adenopathy. No evident bone lesions. There is aortic atherosclerosis. IMPRESSION: No edema or consolidation.  There is aortic atherosclerosis. Aortic Atherosclerosis (ICD10-I70.0). Electronically Signed   By: Lowella Grip III M.D.   On: 09/25/2017 12:19   Ct Head Wo Contrast  Result Date: 09/25/2017 CLINICAL DATA:  Confusion EXAM: CT HEAD WITHOUT CONTRAST TECHNIQUE: Contiguous axial images were obtained from the base of the skull through the vertex without intravenous contrast. COMPARISON:  02/12/2014 FINDINGS: Brain: Atrophic changes and chronic white matter ischemic change is seen. No findings to suggest acute hemorrhage, acute infarction or  space-occupying mass lesion are noted. Vascular: No hyperdense vessel or unexpected calcification. Skull: Normal. Negative for fracture or focal lesion. Sinuses/Orbits: No acute finding. Other: None. IMPRESSION: Chronic atrophic and ischemic changes without acute abnormality. Electronically Signed  By: Inez Catalina M.D.   On: 09/25/2017 14:17    Pending Labs Unresulted Labs (From admission, onward)   Start     Ordered   09/25/17 1040  Urine culture  Add-on,   STAT     09/25/17 1039   09/25/17 0949  Culture, blood (Routine x 2)  BLOOD CULTURE X 2,   STAT     09/25/17 0949   Signed and Held  CBC  (enoxaparin (LOVENOX)    CrCl >/= 30 ml/min)  Once,   R    Comments:  Baseline for enoxaparin therapy IF NOT ALREADY DRAWN.  Notify MD if PLT < 100 K.    Signed and Held   Signed and Held  Creatinine, serum  (enoxaparin (LOVENOX)    CrCl >/= 30 ml/min)  Once,   R    Comments:  Baseline for enoxaparin therapy IF NOT ALREADY DRAWN.    Signed and Held   Signed and Held  Creatinine, serum  (enoxaparin (LOVENOX)    CrCl >/= 30 ml/min)  Weekly,   R    Comments:  while on enoxaparin therapy    Signed and Held   Signed and Held  Comprehensive metabolic panel  Tomorrow morning,   R     Signed and Held   Signed and Held  CBC  Tomorrow morning,   R     Signed and Held   Signed and Held  Magnesium  Tomorrow morning,   R     Signed and Held      Vitals/Pain Today's Vitals   09/25/17 1430 09/25/17 1445 09/25/17 1521 09/25/17 1558  BP: (!) 156/92 (!) 166/93 (!) 171/98 (!) 165/91  Pulse: (!) 111 (!) 113 (!) 109 (!) 112  Resp:  '14 18 19  ' Temp:  98.4 F (36.9 C) 98.4 F (36.9 C) 98.4 F (36.9 C)  TempSrc:  Oral Oral Oral  SpO2: 94% 97% 96% 96%  Weight:        Isolation Precautions No active isolations  Medications Medications  potassium chloride SA (K-DUR,KLOR-CON) CR tablet 40 mEq (has no administration in time range)  lactated ringers bolus 500 mL (500 mLs Intravenous New Bag/Given 09/25/17  1537)  lactated ringers infusion (has no administration in time range)  cefTRIAXone (ROCEPHIN) 1 g in sodium chloride 0.9 % 100 mL IVPB (has no administration in time range)  sodium chloride 0.9 % bolus 1,000 mL ( Intravenous Stopped 09/25/17 1155)  cefTRIAXone (ROCEPHIN) 1 g in sodium chloride 0.9 % 100 mL IVPB (0 g Intravenous Stopped 09/25/17 1311)  potassium chloride SA (K-DUR,KLOR-CON) CR tablet 40 mEq (40 mEq Oral Given 09/25/17 1237)    Mobility non-ambulatory currently

## 2017-09-25 NOTE — H&P (Addendum)
History and Physical    Monique Rush UXN:[id number] DOB: 1940-05-05 DOA: 09/25/2017  PCP: Benito Mccreedy, MD  Patient coming from: home  I have personally briefly reviewed patient's old medical records in [place]  Chief Complaint: AMS  HPI: Monique Rush is a 77 y.o. female with medical history significant of HTN, dementia presenting with altered mental status.  Patient is unable to provide any good history due to her mental status.  Her son notes that he was called by EMS and they told him that she had called looking for her husband.  Her husband is deceased.  The patient is able to say that she is here partially because her memory was not as good as it should be.  She denies any fevers, she denies any chills or chest pain.  She denies any shortness of breath.  She notes some abdominal pain that comes and goes.  She points to the bellybutton when asked to locate this.  She denies any dysuria or symptoms of a urinary tract infection.  She notes some back pain has been going on for a couple weeks as well.  Her son notes that she has been a little bit more confused over the past couple of weeks.  He had her sent to the hospital today because he felt like it was getting worse.  She lives at home alone.  ED Course: Labs, EKG, head CT.  Admit for AMS and possible UTI.    Review of Systems: As per HPI otherwise 10 point review of systems negative.   Past Medical History:  Diagnosis Date  . Encephalopathy   . Hypertension   . Insomnia   . Urine retention     Past Surgical History:  Procedure Laterality Date  . ANKLE SURGERY     Left  . CHOLECYSTECTOMY    . HIP ARTHROPLASTY Left 02/11/2014   Procedure: ARTHROPLASTY BIPOLAR HIP;  Surgeon: Mauri Pole, MD;  Location: [facility];  Service: Orthopedics;  Laterality: Left;     reports that she has never smoked. She has never used smokeless tobacco. She reports that she does not drink alcohol or use drugs.  Allergies  Allergen  Reactions  . Morphine And Related Nausea And Vomiting  . Oxycontin [Oxycodone Hcl] Nausea And Vomiting  . Percocet [Oxycodone-Acetaminophen] Nausea And Vomiting    Family History  Problem Relation Age of Onset  . Alzheimer's disease Mother   . Heart disease Father    Prior to Admission medications   Medication Sig Start Date End Date Taking? Authorizing Provider  amLODipine (NORVASC) 10 MG tablet Take 1 tablet (10 mg total) by mouth daily. 02/16/14  Yes Thurnell Lose, MD  aspirin EC 325 MG tablet Take 1 tablet (325 mg total) by mouth 2 (two) times daily. Take for 4 weeks 02/11/14  Yes Paralee Cancel, MD  baclofen (LIORESAL) 10 MG tablet Take 10 mg by mouth 3 (three) times daily as needed for muscle spasms.  03/18/14  Yes [provider]  buPROPion (WELLBUTRIN XL) 150 MG 24 hr tablet Take 150 mg by mouth daily.   Yes [provider]  carvedilol (COREG) 12.5 MG tablet Take 1 tablet (12.5 mg total) by mouth 2 (two) times daily with a meal. Patient taking differently: Take 12.5 mg by mouth daily.  02/16/14  Yes Thurnell Lose, MD  dimenhyDRINATE (DRAMAMINE PO) Take 1 tablet by mouth daily as needed (dizziness).   Yes [provider]  donepezil (ARICEPT) 5  MG tablet Take 1 tablet (5 mg total) by mouth at bedtime. 02/16/14  Yes Thurnell Lose, MD  hydrALAZINE (APRESOLINE) 25 MG tablet Take 1 tablet (25 mg total) by mouth every 8 (eight) hours. Patient taking differently: Take 25 mg by mouth 2 (two) times daily. 0:00, 08:00, 16:00 02/16/14  Yes Thurnell Lose, MD  hydrochlorothiazide (HYDRODIURIL) 12.5 MG tablet Take 12.5 mg by mouth daily. 06/22/17  Yes [provider]  meloxicam (MOBIC) 7.5 MG tablet Take 7.5 mg by mouth daily. 07/09/17  Yes [provider]  metoprolol succinate (TOPROL-XL) 50 MG 24 hr tablet Take 50 mg by mouth daily. Take with or immediately following a meal.   Yes [provider]  mirtazapine (REMERON) 30 MG tablet  Take 30 mg by mouth at bedtime. 09/10/17  Yes [provider]  QUEtiapine (SEROQUEL) 100 MG tablet Take 100 mg by mouth at bedtime as needed. For sleep   Yes [provider]  naproxen (NAPROSYN) 500 MG tablet Take 1 tablet (500 mg total) by mouth 2 (two) times daily with a meal. Patient not taking: Reported on 09/25/2017 10/27/16   Jessy Oto, MD  naproxen (NAPROSYN) 500 MG tablet TAKE 1 TABLET BY MOUTH TWICE DAILY WITH FOOD Patient not taking: Reported on 09/25/2017 12/04/16   Jessy Oto, MD  ondansetron (ZOFRAN) 4 MG tablet Take 1 tablet (4 mg total) by mouth every 6 (six) hours. Patient not taking: Reported on 09/25/2017 06/22/14   Dorie Rank, MD    Physical Exam: Vitals:   09/25/17 1445 09/25/17 1521 09/25/17 1558 09/25/17 1648  BP: (!) 166/93 (!) 171/98 (!) 165/91 (!) 167/93  Pulse: (!) 113 (!) 109 (!) 112 (!) 107  Resp: 14 18 19 18   Temp: 98.4 F (36.9 C) 98.4 F (36.9 C) 98.4 F (36.9 C) 98.1 F (36.7 C)  TempSrc: Oral Oral Oral Oral  SpO2: 97% 96% 96% (!) 89%  Weight:    69.2 kg (152 lb 8.9 oz)  Height:    5\' 9"  (1.753 m)    Constitutional: NAD, calm, comfortable Vitals:   09/25/17 1445 09/25/17 1521 09/25/17 1558 09/25/17 1648  BP: (!) 166/93 (!) 171/98 (!) 165/91 (!) 167/93  Pulse: (!) 113 (!) 109 (!) 112 (!) 107  Resp: 14 18 19 18   Temp: 98.4 F (36.9 C) 98.4 F (36.9 C) 98.4 F (36.9 C) 98.1 F (36.7 C)  TempSrc: Oral Oral Oral Oral  SpO2: 97% 96% 96% (!) 89%  Weight:    69.2 kg (152 lb 8.9 oz)  Height:    5\' 9"  (1.753 m)   Eyes: PERRL, lids and conjunctivae normal ENMT: Mucous membranes are moist. Posterior pharynx clear of any exudate or lesions.Normal dentition.  Neck: normal, supple, no masses, no thyromegaly Respiratory: clear to auscultation bilaterally, no wheezing, no crackles. Normal respiratory effort. No accessory muscle use.  Cardiovascular: Regular rate and rhythm, no murmurs / rubs / gallops. No extremity edema. 2+ pedal pulses.  No carotid bruits.  Abdomen: no tenderness, no masses palpated. No hepatosplenomegaly. Bowel sounds positive.  Mild r sided TTP of back.   Musculoskeletal: no clubbing / cyanosis. No joint deformity upper and lower extremities. Good ROM, no contractures. Normal muscle tone.  Skin: no rashes, lesions, ulcers. No induration Neurologic: CN 2-12 grossly intact. Sensation intact. Strength 5/5 in all 4.  Psychiatric: Normal judgment and insight. Alert and oriented x 1-2. Normal mood.   Labs on Admission: I have personally reviewed following labs and imaging  studies  CBC: Recent Labs  Lab 09/25/17 0949  WBC 8.2  NEUTROABS 5.7  HGB 9.7*  HCT 30.4*  MCV 87.4  PLT 937   Basic Metabolic Panel: Recent Labs  Lab 09/25/17 0949  NA 146*  K 2.7*  CL 101  CO2 31  GLUCOSE 131*  BUN 20  CREATININE 1.01*  CALCIUM 12.2*   GFR: Estimated Creatinine Clearance: 48.7 mL/min (A) (by C-G formula based on SCr of 1.01 mg/dL (H)). Liver Function Tests: Recent Labs  Lab 09/25/17 0949  AST 84*  ALT 21  ALKPHOS 120  BILITOT 0.9  PROT 6.9  ALBUMIN 3.4*   No results for input(s): LIPASE, AMYLASE in the last 168 hours. No results for input(s): AMMONIA in the last 168 hours. Coagulation Profile: Recent Labs  Lab 09/25/17 0949  INR 0.99   Cardiac Enzymes: No results for input(s): CKTOTAL, CKMB, CKMBINDEX, TROPONINI in the last 168 hours. BNP (last 3 results) No results for input(s): PROBNP in the last 8760 hours. HbA1C: No results for input(s): HGBA1C in the last 72 hours. CBG: No results for input(s): GLUCAP in the last 168 hours. Lipid Profile: No results for input(s): CHOL, HDL, LDLCALC, TRIG, CHOLHDL, LDLDIRECT in the last 72 hours. Thyroid Function Tests: No results for input(s): TSH, T4TOTAL, FREET4, T3FREE, THYROIDAB in the last 72 hours. Anemia Panel: No results for input(s): VITAMINB12, FOLATE, FERRITIN, TIBC, IRON, RETICCTPCT in the last 72 hours. Urine analysis:      Component Value Date/Time   COLORURINE YELLOW 09/25/2017 [place] 09/25/2017 0949   LABSPEC 1.013 09/25/2017 0949   PHURINE 6.0 09/25/2017 0949   GLUCOSEU NEGATIVE 09/25/2017 0949   HGBUR SMALL (A) 09/25/2017 0949   BILIRUBINUR NEGATIVE 09/25/2017 0949   KETONESUR NEGATIVE 09/25/2017 0949   PROTEINUR NEGATIVE 09/25/2017 0949   UROBILINOGEN 1.0 02/20/2014 1520   NITRITE NEGATIVE 09/25/2017 0949   LEUKOCYTESUR LARGE (A) 09/25/2017 0949    Radiological Exams on Admission: Dg Chest 2 View  Result Date: 09/25/2017 CLINICAL DATA:  Confusion EXAM: CHEST - 2 VIEW COMPARISON:  February 10, 2014 FINDINGS: There is no edema or consolidation. The heart size and pulmonary vascularity are normal. No adenopathy. No evident bone lesions. There is aortic atherosclerosis. IMPRESSION: No edema or consolidation.  There is aortic atherosclerosis. Aortic Atherosclerosis (ICD10-I70.0). Electronically Signed   By: Lowella Grip III M.D.   On: 09/25/2017 12:19   Ct Head Wo Contrast  Result Date: 09/25/2017 CLINICAL DATA:  Confusion EXAM: CT HEAD WITHOUT CONTRAST TECHNIQUE: Contiguous axial images were obtained from the base of the skull through the vertex without intravenous contrast. COMPARISON:  02/12/2014 FINDINGS: Brain: Atrophic changes and chronic white matter ischemic change is seen. No findings to suggest acute hemorrhage, acute infarction or space-occupying mass lesion are noted. Vascular: No hyperdense vessel or unexpected calcification. Skull: Normal. Negative for fracture or focal lesion. Sinuses/Orbits: No acute finding. Other: None. IMPRESSION: Chronic atrophic and ischemic changes without acute abnormality. Electronically Signed   By: Inez Catalina M.D.   On: 09/25/2017 14:17    EKG: Independently reviewed. Sinus tachycardia.  Appears similar to priors.  Assessment/Plan Active Problems:   Acute metabolic encephalopathy  Acute Metabolic Encephalopathy:  This is most likely 2/2  UTI vs hypercalcemia.  She's A&Ox1-2 (knew hospital, but not [facility] and did not know month).  Head CT with chronic atrophy and ischemic changes.  No focal neurologic deficits.   Follow B12, TSH Follow on abx and IVF. W/u further as indicated  UTI:  UA concerning for UTI with WBC, RBC, + LE.  She has mild R sided back tenderness, ? msk vs pyelo.  Follow urine culture, ceftriaxone Follow blood cx  Hypercalcemia: could be contributing. Follow with IVF.  Follow PTH.    Hypokalemia: replete, follow, follow magnesium  Anemia: no evidence of bleeding.  Follow fecal occult blood.  Follow iron studies, b12, folate.  This is lower than noted previously in past.  Tachycardia: suspect this is 2/2 above.  CXR without edema or consolidation.  Follow with IVF.  Follow TSH.  No CP or SOB.  W/u further if persistent.  LE Edema: follow LE [place]  Hypertension:  Pt with odd regimen per her memory.  Son waiting on med list from PCP.  For now continue norvasc and metoprolol and HCTZ.  Holding coreg which she states she also takes once daily and holding hydralazine which she states she takes once daily.   Depression  Insomnia: Wellbutrin, remeron, seroquel, follow med list  Dementia: pt no longer taking aricept, delirium precautions  Elevated liver enzymes: follow  Please check to see if son was able to obtain med list from PCP  DVT prophylaxis: lovenox  Code Status: full code  Family Communication: son  Disposition Plan: pending improvement Consults called: none  Admission status: observation   Fayrene Helper MD [facility] Pager [phone]  If 7PM-7AM, please contact night-coverage [email or web address] Password [facility]  09/25/2017, 5:39 PM

## 2017-09-25 NOTE — Progress Notes (Signed)
Pt HR sustaining in the 120s. NP on call paged, order for stat EKG placed and carried out. HR resolved on its on. Will continue to monitor closely.

## 2017-09-25 NOTE — ED Notes (Signed)
CRITICAL VALUE STICKER  CRITICAL VALUE: Potassium 2.7  RECEIVER (on-site recipient of call): A.Brawner,RN  DATE & TIME NOTIFIED: 09/25/17  MESSENGER (representative from lab): Lonia Skinner, Lab  MD NOTIFIED: Cook,MD  TIME OF NOTIFICATION: 1101  RESPONSE: See orders

## 2017-09-25 NOTE — ED Notes (Signed)
Bed: WA21 Expected date: 09/25/17 Expected time: 9:05 AM Means of arrival: Ambulance Comments: AMS ?UTI

## 2017-09-26 ENCOUNTER — Observation Stay (HOSPITAL_COMMUNITY): Payer: Medicare Other

## 2017-09-26 DIAGNOSIS — Z8249 Family history of ischemic heart disease and other diseases of the circulatory system: Secondary | ICD-10-CM | POA: Diagnosis not present

## 2017-09-26 DIAGNOSIS — F329 Major depressive disorder, single episode, unspecified: Secondary | ICD-10-CM

## 2017-09-26 DIAGNOSIS — I10 Essential (primary) hypertension: Secondary | ICD-10-CM

## 2017-09-26 DIAGNOSIS — N390 Urinary tract infection, site not specified: Secondary | ICD-10-CM | POA: Diagnosis present

## 2017-09-26 DIAGNOSIS — R000 Tachycardia, unspecified: Secondary | ICD-10-CM | POA: Diagnosis present

## 2017-09-26 DIAGNOSIS — G4700 Insomnia, unspecified: Secondary | ICD-10-CM | POA: Diagnosis present

## 2017-09-26 DIAGNOSIS — R609 Edema, unspecified: Secondary | ICD-10-CM | POA: Diagnosis not present

## 2017-09-26 DIAGNOSIS — R410 Disorientation, unspecified: Secondary | ICD-10-CM | POA: Diagnosis not present

## 2017-09-26 DIAGNOSIS — G9341 Metabolic encephalopathy: Secondary | ICD-10-CM

## 2017-09-26 DIAGNOSIS — S22000A Wedge compression fracture of unspecified thoracic vertebra, initial encounter for closed fracture: Secondary | ICD-10-CM | POA: Diagnosis not present

## 2017-09-26 DIAGNOSIS — E876 Hypokalemia: Secondary | ICD-10-CM | POA: Diagnosis present

## 2017-09-26 DIAGNOSIS — R740 Nonspecific elevation of levels of transaminase and lactic acid dehydrogenase [LDH]: Secondary | ICD-10-CM | POA: Diagnosis present

## 2017-09-26 DIAGNOSIS — Z96642 Presence of left artificial hip joint: Secondary | ICD-10-CM | POA: Diagnosis present

## 2017-09-26 DIAGNOSIS — Z820 Family history of epilepsy and other diseases of the nervous system: Secondary | ICD-10-CM | POA: Diagnosis not present

## 2017-09-26 DIAGNOSIS — D649 Anemia, unspecified: Secondary | ICD-10-CM | POA: Diagnosis present

## 2017-09-26 DIAGNOSIS — R600 Localized edema: Secondary | ICD-10-CM | POA: Diagnosis present

## 2017-09-26 DIAGNOSIS — F0390 Unspecified dementia without behavioral disturbance: Secondary | ICD-10-CM | POA: Diagnosis present

## 2017-09-26 DIAGNOSIS — M4854XA Collapsed vertebra, not elsewhere classified, thoracic region, initial encounter for fracture: Secondary | ICD-10-CM | POA: Diagnosis present

## 2017-09-26 DIAGNOSIS — R748 Abnormal levels of other serum enzymes: Secondary | ICD-10-CM | POA: Diagnosis present

## 2017-09-26 LAB — IRON AND TIBC
Iron: 62 ug/dL (ref 28–170)
SATURATION RATIOS: 20 % (ref 10.4–31.8)
TIBC: 315 ug/dL (ref 250–450)
UIBC: 253 ug/dL

## 2017-09-26 LAB — COMPREHENSIVE METABOLIC PANEL
ALT: 20 U/L (ref 0–44)
ANION GAP: 13 (ref 5–15)
AST: 80 U/L — ABNORMAL HIGH (ref 15–41)
Albumin: 3.1 g/dL — ABNORMAL LOW (ref 3.5–5.0)
Alkaline Phosphatase: 106 U/L (ref 38–126)
BUN: 13 mg/dL (ref 8–23)
CO2: 29 mmol/L (ref 22–32)
Calcium: 10.6 mg/dL — ABNORMAL HIGH (ref 8.9–10.3)
Chloride: 99 mmol/L (ref 98–111)
Creatinine, Ser: 0.95 mg/dL (ref 0.44–1.00)
GFR calc non Af Amer: 56 mL/min — ABNORMAL LOW (ref >60)
Glucose, Bld: 106 mg/dL — ABNORMAL HIGH (ref 70–99)
POTASSIUM: 3.5 mmol/L (ref 3.5–5.1)
SODIUM: 141 mmol/L (ref 135–145)
Total Bilirubin: 0.7 mg/dL (ref 0.3–1.2)
Total Protein: 6 g/dL — ABNORMAL LOW (ref 6.5–8.1)

## 2017-09-26 LAB — URINE CULTURE

## 2017-09-26 LAB — CBC
HEMATOCRIT: 28.1 % — AB (ref 36.0–46.0)
HEMOGLOBIN: 9 g/dL — AB (ref 12.0–15.0)
MCH: 28 pg (ref 26.0–34.0)
MCHC: 32 g/dL (ref 30.0–36.0)
MCV: 87.3 fL (ref 78.0–100.0)
Platelets: 212 10*3/uL (ref 150–400)
RBC: 3.22 MIL/uL — AB (ref 3.87–5.11)
RDW: 15.7 % — ABNORMAL HIGH (ref 11.5–15.5)
WBC: 8.2 10*3/uL (ref 4.0–10.5)

## 2017-09-26 LAB — FERRITIN: FERRITIN: 148 ng/mL (ref 11–307)

## 2017-09-26 LAB — AMMONIA: Ammonia: 10 umol/L (ref 9–35)

## 2017-09-26 LAB — PTH, INTACT AND CALCIUM
CALCIUM TOTAL (PTH): 11.1 mg/dL — AB (ref 8.7–10.3)
PTH: 9 pg/mL — AB (ref 15–65)

## 2017-09-26 LAB — FOLATE: Folate: 8.9 ng/mL (ref >5.9)

## 2017-09-26 LAB — MAGNESIUM: MAGNESIUM: 1.9 mg/dL (ref 1.7–2.4)

## 2017-09-26 MED ORDER — HYDRALAZINE HCL 20 MG/ML IJ SOLN: 5.0000 mg | Freq: Four times a day (QID) | INTRAMUSCULAR | Status: DC | PRN

## 2017-09-26 MED ORDER — ENSURE ENLIVE PO LIQD
237.0000 mL | Freq: Two times a day (BID) | ORAL | Status: DC
  Administered 2017-09-26 – 2017-10-01 (×11): 237 mL via ORAL

## 2017-09-26 NOTE — Evaluation (Signed)
Occupational Therapy Evaluation Patient Details Name: Monique Rush MRN: [id number] DOB: Jun 29, 1940 Today's Date: 09/26/2017    History of Present Illness 77 yo female admitted with acute met encephalopathy, hypokalemia, UTI. Hx of L THA 2015, dementia, falls   Clinical Impression   Pt was admitted for the above.  Per chart, she lived alone at baseline.  Pt needs min A for adls and will need SNF after discharge unless she has 24/7 supervision.  Will follow in acute setting with min guard level goals    Follow Up Recommendations  SNF    Equipment Recommendations  3 in 1 bedside commode    Recommendations for Other Services       Precautions / Restrictions Precautions Precautions: Fall Restrictions Weight Bearing Restrictions: No      Mobility Bed Mobility Overal bed mobility: Needs Assistance Bed Mobility: Supine to Sit;Sit to Supine     Supine to sit: Min guard Sit to supine: Min guard   General bed mobility comments: for safety  Transfers Overall transfer level: Needs assistance Equipment used: Rolling walker (2 wheeled) Transfers: Sit to/from Stand Sit to Stand: Min assist         General transfer comment: min A to stand stand stabilize    Balance Overall balance assessment: Needs assistance;History of Falls         Standing balance support: Bilateral upper extremity supported Standing balance-Leahy Scale: Poor Standing balance comment: RW required at this time                           ADL either performed or assessed with clinical judgement   ADL Overall ADL's : Needs assistance/impaired Eating/Feeding: Independent   Grooming: Wash/dry hands;Set up;Bed level   Upper Body Bathing: Minimal assistance;Sitting   Lower Body Bathing: Minimal assistance;Sit to/from stand   Upper Body Dressing : Minimal assistance;Sitting   Lower Body Dressing: Minimal assistance;Sit to/from stand                 General ADL Comments: Pt had IV  in her hand when I arrived and arm was bleeding. Assisted with cleaning up and donning new gown and socks.  Stood and sidestepped up [place]      Pertinent Vitals/Pain Pain Assessment: No/denies pain     Hand Dominance Right   Extremity/Trunk Assessment Upper Extremity Assessment Upper Extremity Assessment: Generalized weakness      Cervical / Trunk Assessment Cervical / Trunk Assessment: Kyphotic   Communication Communication Communication: No difficulties   Cognition Arousal/Alertness: Awake/alert Behavior During Therapy: WFL for tasks assessed/performed Overall Cognitive Status: No family/caregiver present to determine baseline cognitive functioning                                 General Comments: h/o dementia; confabulated reason for being here.  Follows one step commands; had just pulled IV out   General Comments       Exercises     Shoulder Instructions      Home Living Family/patient expects to be discharged to:: Unsure Living Arrangements: Alone                               Additional Comments: questionable historian-no family present  Prior Functioning/Environment Level of Independence: Needs assistance  Gait / Transfers Assistance Needed: quad cane present in hospital room-likey using this for ambulation     Comments: states son lives nearby and checks on her a couple of times a day        OT Problem List: Decreased strength;Decreased activity tolerance;Impaired balance (sitting and/or standing);Decreased cognition;Decreased safety awareness;Pain      OT Treatment/Interventions: Self-care/ADL training;DME and/or AE instruction;Cognitive remediation/compensation;Therapeutic activities;Patient/family education;Balance training    OT Goals(Current goals can be found in the care plan section) Acute Rehab OT Goals Patient Stated Goal: none stated OT Goal Formulation: With  patient Time For Goal Achievement: 10/10/17 Potential to Achieve Goals: Good ADL Goals Pt Will Transfer to Toilet: with min guard assist;ambulating;bedside commode Additional ADL Goal #1: pt will perform ADL with min guard assistance for sit to stand and cues for thoroughness Additional ADL Goal #2: pt will not need any more than one cue for safety during adl  OT Frequency: Min 2X/week   Barriers to D/C:            Co-evaluation              AM-PAC PT "6 Clicks" Daily Activity     Outcome Measure Help from another person eating meals?: None Help from another person taking care of personal grooming?: A Little Help from another person toileting, which includes using toliet, bedpan, or urinal?: A Little Help from another person bathing (including washing, rinsing, drying)?: A Little Help from another person to put on and taking off regular upper body clothing?: A Little Help from another person to put on and taking off regular lower body clothing?: A Little 6 Click Score: 19   End of Session Nurse Communication: (IV out)  Activity Tolerance: Patient tolerated treatment well Patient left: in bed;with call bell/phone within reach;with bed alarm set(reoriented to call bell)  OT Visit Diagnosis: Unsteadiness on feet (R26.81)                Time: 1040-1057 OT Time Calculation (min): 17 min Charges:  OT General Charges $OT Visit: 1 Visit OT Evaluation $OT Eval Low Complexity: 1 Low  [place], OTR/L [phone] 09/26/2017  Keats Kingry 09/26/2017, 12:02 PM

## 2017-09-26 NOTE — Progress Notes (Signed)
Bilateral lower extremity venous duplex has been completed. Negative for obvious evidence of DVT.  09/26/17 9:43 AM Monique Rush RVT

## 2017-09-26 NOTE — Progress Notes (Addendum)
Initial Nutrition Assessment  DOCUMENTATION CODES:   (Will attempt to assess for malnutrition at follow-up. )  INTERVENTION:  - Will order Ensure Enlive BID, each supplement provides 350 kcal and 20 grams of protein. - Continue to encourage PO intakes.  - Tech/RN to provide feeding assistance, if needed.   NUTRITION DIAGNOSIS:   Predicted suboptimal nutrient intake related to lethargy/confusion as evidenced by other (comment)(hx dementia with working dx of acute metabolic encephalopathy).  GOAL:   Patient will meet greater than or equal to 90% of their needs  MONITOR:   PO intake, Supplement acceptance, Weight trends, Labs  REASON FOR ASSESSMENT:   Malnutrition Screening Tool  ASSESSMENT:   77 y.o. female with medical history significant of HTN and dementia. She presented to the ED with AMS; her son noted that he was called by EMS and they told him that she had called looking for her husband. Her husband is deceased. She noted some abdominal pain that comes and goes. She pointed to the bellybutton when asked to locate this. She noted some back pain has been going on for a couple weeks as well. Her son noted that she has been a little bit more confused over the past couple of weeks and he had her sent to the hospital today because he felt like it was getting worse. She lives at home alone.  BMI indicates normal weight. No weight in chart since 2016. Patient noted to be a/o to self only and no family/visitors present at bedside. No intakes documented in flow sheet and tech unsure if patient had breakfast/how much she may have eaten. Patient confused during RD visit stating that she is leaving this afternoon with her husband and that her son is having surgery, that is why she is here. Unable to obtain any information at this time. Patient declined NFPE as she states she is leaving soon. She denies abdominal pain or nausea when asked about this by RD.   Medications reviewed; 12.5 mg  Hydrodiuril/day, 40 mEq oral KCl x3 doses yesterday Labs reviewed; Ca: 10.6 mg/dL, GFR: 56 mL/min. IVF: LR @ 100 mL/hr.    ADDENDUM at 1437:  Spoke with tech again a few minutes ago and she reports that patient has not had anything to eat today, but she (tech) was preparing to order lunch for patient.     NUTRITION - FOCUSED PHYSICAL EXAM:  Patient declined at this time.  Diet Order:   Diet Order           Diet Heart Room service appropriate? Yes; Fluid consistency: Thin  Diet effective now          EDUCATION NEEDS:   No education needs have been identified at this time  Skin:  Skin Assessment: Reviewed RN Assessment  Last BM:  8/5  Height:   Ht Readings from Last 1 Encounters:  09/25/17 5\' 9"  (1.753 m)    Weight:   Wt Readings from Last 1 Encounters:  09/26/17 153 lb 3.5 oz (69.5 kg)    Ideal Body Weight:  65.91 kg  BMI:  Body mass index is 22.63 kg/m.  Estimated Nutritional Needs:   Kcal:  1530-1740 (22-25 kcal/kg)  Protein:  60-70 grams  Fluid:  >/= 1.7 L/day     Jarome Matin, MS, RD, LDN, [facility] Inpatient Clinical Dietitian Pager # [phone] After hours/weekend pager # [phone]

## 2017-09-26 NOTE — Evaluation (Addendum)
Physical Therapy Evaluation Patient Details Name: KRYSTELLE PRASHAD MRN: [id number] DOB: 07-04-1940 Today's Date: 09/26/2017   History of Present Illness  77 yo female admitted with acute met encephalopathy, hypokalemia, UTI. Hx of L THA 2015, dementia, falls  Clinical Impression  On eval, pt required Min-Mod assist for mobility. She walked ~40 feet with a RW. Pt is very unsteady, unsafe, and at risk for falls. Pt presents with general weakness, decreased activity tolerance, and impaired gait and balance. No family present during session. Pt remains very confused. Unsure if she will be able to safely manage at home alone. Will recommend SNF at this time. Will continue to follow. If SNF is not an option, pt will require 24 hour supervision/assist.     Follow Up Recommendations SNF    Equipment Recommendations  Rolling walker with 5" wheels(if pt doesn't already have one)    Recommendations for Other Services OT consult     Precautions / Restrictions Precautions Precautions: Fall Restrictions Weight Bearing Restrictions: No      Mobility  Bed Mobility               General bed mobility comments: oob in recliner  Transfers Overall transfer level: Needs assistance Equipment used: Rolling walker (2 wheeled) Transfers: Sit to/from Stand Sit to Stand: Min assist; Mod assist         General transfer comment: Assist to rise, stabilize, control descent. Pt attempted to sit before safely positioned. Very unsafe. External assist provided to prevent fall  Ambulation/Gait Ambulation/Gait assistance: Min assist Gait Distance (Feet): 40 Feet Assistive device: Rolling walker (2 wheeled) Gait Pattern/deviations: Trunk flexed;Step-through pattern     General Gait Details: Assist to stabilize pt and to maneuver safely with RW throughout distance. Multimodal cueing required. Pt is unsteady and at high risk for falls. Fatigues very easily.   Stairs            Wheelchair  Mobility    Modified Rankin (Stroke Patients Only)       Balance Overall balance assessment: Needs assistance;History of Falls         Standing balance support: Bilateral upper extremity supported Standing balance-Leahy Scale: Poor Standing balance comment: RW required at this time                             Pertinent Vitals/Pain Pain Assessment: Pt c/o bil LE pain/soreness.     Home Living Family/patient expects to be discharged to:: Unsure Living Arrangements: Alone               Additional Comments: questionable historian-no family present    Prior Function Level of Independence: Needs assistance   Gait / Transfers Assistance Needed: cane present in hospital room-likey using this for ambulation           Hand Dominance        Extremity/Trunk Assessment   Upper Extremity Assessment Upper Extremity Assessment: Generalized weakness. Edema noted.    Lower Extremity Assessment Lower Extremity Assessment: Generalized weakness. Edema noted.     Cervical / Trunk Assessment Cervical / Trunk Assessment: Kyphotic  Communication   Communication: No difficulties  Cognition Arousal/Alertness: Awake/alert Behavior During Therapy: WFL for tasks assessed/performed Overall Cognitive Status: History of cognitive impairments - at baseline                                 General  Comments: Pt appeared to be having hallucinations during PT session (pointed over to window seat and mentioned something about rabbits)      General Comments      Exercises     Assessment/Plan    PT Assessment Patient needs continued PT services  PT Problem List Decreased strength;Decreased balance;Decreased mobility;Decreased activity tolerance;Decreased cognition;Decreased knowledge of use of DME;Decreased safety awareness       PT Treatment Interventions DME instruction;Gait training;Functional mobility training;Therapeutic activities;Balance  training;Patient/family education;Therapeutic exercise    PT Goals (Current goals can be found in the Care Plan section)  Acute Rehab PT Goals Patient Stated Goal: none stated PT Goal Formulation: Patient unable to participate in goal setting Time For Goal Achievement: 10/10/17 Potential to Achieve Goals: Good    Frequency Min 3X/week   Barriers to discharge        Co-evaluation               AM-PAC PT "6 Clicks" Daily Activity  Outcome Measure Difficulty turning over in bed (including adjusting bedclothes, sheets and blankets)?: A Lot Difficulty moving from lying on back to sitting on the side of the bed? : Unable Difficulty sitting down on and standing up from a chair with arms (e.g., wheelchair, bedside commode, etc,.)?: Unable Help needed moving to and from a bed to chair (including a wheelchair)?: A Little Help needed walking in hospital room?: A Little Help needed climbing 3-5 steps with a railing? : A Lot 6 Click Score: 12    End of Session Equipment Utilized During Treatment: Gait belt Activity Tolerance: Patient limited by fatigue Patient left: in chair;with call bell/phone within reach;with chair alarm set   PT Visit Diagnosis: Muscle weakness (generalized) (M62.81);Difficulty in walking, not elsewhere classified (R26.2);History of falling (Z91.81)    Time: 2683-4196 PT Time Calculation (min) (ACUTE ONLY): 15 min   Charges:   PT Evaluation $PT Eval Moderate Complexity: 1 Mod           Weston Anna, MPT Pager: [phone]

## 2017-09-26 NOTE — Progress Notes (Signed)
PROGRESS NOTE    Monique Rush  JWJ:[id number] DOB: 21-Nov-1940 DOA: 09/25/2017 PCP: Benito Mccreedy, MD   Brief Narrative: Monique Rush is a 77 y.o. female with medical history significant of HTN, dementia. Patient presented with altered mental status, thought secondary to infection vs hypercalcemia. History of PRES with elevated blood pressure on admission. CT head unremarkable.   Assessment & Plan:   Active Problems:   Acute metabolic encephalopathy   Urinary tract infection without hematuria   Acute metabolic encephalopathy Thought to be secondary to infection. Unable to get in touch with son today, but per report, patient is more oriented at baseline and functional (driving, house work, Social research officer, government). Patient oriented to person and place only. Not oriented to situation. Urine culture unhelpful. Blood culture no growth to date. CT head unremarkable for acute process. History of PRES.  -Blood culture pending -Continue Ceftriaxone for empiric UTI treatment  Hypercalcemia Calcium improved with IV fluids. May be exacerbated by hydrochlorothiazide -Repeat BMP  Hypokalemia Resolved with repletion. Magnesium normal.  Anemia Last hemoglobin from 2 years ago. Stable this admission. Anemia panel unremarkable. No active bleeding.  Tachycardia Sinus. Improved this morning with IV fluids  LE edema No DVT on LE duplex  Essential hypertension Not well controlled -Continue amlodipine (added), hydrochlorothiazide, metoprolol; if still uncontrolled -Hydralazine prn  Depression/Insomnia -Continue Seroquel and Remeron  Dementia Not on medications as an outpatient. Previously on Aricept.   Elevated AST Mild. Trended down slightly.   DVT prophylaxis: Lovenox Code Status:   Code Status: Full Code Family Communication: None at bedside. Called son with no response Disposition Plan: Discharge likely home in 24 hours if mental status improved.   Consultants:   None  Procedures:     None  Antimicrobials:  Ceftriaxone    Subjective: No concerns overnight.  Objective: Vitals:   09/25/17 2114 09/25/17 2217 09/26/17 0443 09/26/17 0446  BP: (!) 149/72 (!) 151/83  (!) 156/76  Pulse: (!) 103 (!) 128  (!) 111  Resp: 18   17  Temp: 98.2 F (36.8 C)   98.3 F (36.8 C)  TempSrc: Oral   Oral  SpO2: 94%   94%  Weight:   69.5 kg (153 lb 3.5 oz)   Height:        Intake/Output Summary (Last 24 hours) at 09/26/2017 0955 Last data filed at 09/26/2017 0600 Gross per 24 hour  Intake 1996.17 ml  Output 1600 ml  Net 396.17 ml   Filed Weights   09/25/17 0938 09/25/17 1648 09/26/17 0443  Weight: 74.8 kg (165 lb) 69.2 kg (152 lb 8.9 oz) 69.5 kg (153 lb 3.5 oz)    Examination:  General exam: Appears calm and comfortable Respiratory system: Decreased breath sounds. Respiratory effort normal. Cardiovascular system: S1 & S2 heard, RRR. No murmurs. Gastrointestinal system: Abdomen is nondistended, soft and nontender. No organomegaly or masses felt. Normal bowel sounds heard. Central nervous system: Alert and oriented. No focal neurological deficits. Extremities: 2+ edema. No calf tenderness Skin: No cyanosis. No rashes Psychiatry: Judgement and insight appear normal. Mood & affect appropriate.     Data Reviewed: I have personally reviewed following labs and imaging studies  CBC: Recent Labs  Lab 09/25/17 0949 09/25/17 1800 09/26/17 0547  WBC 8.2 10.1 8.2  NEUTROABS 5.7  --   --   HGB 9.7* 10.1* 9.0*  HCT 30.4* 31.9* 28.1*  MCV 87.4 87.2 87.3  PLT 197 192 621   Basic Metabolic Panel: Recent Labs  Lab 09/25/17 0949 09/25/17  1800 09/25/17 1805 09/25/17 1837 09/26/17 0547  NA 146*  --   --   --  141  K 2.7*  --   --   --  3.5  CL 101  --   --   --  99  CO2 31  --   --   --  29  GLUCOSE 131*  --   --   --  106*  BUN 20  --   --   --  13  CREATININE 1.01* 0.89  --   --  0.95  CALCIUM 12.2*  --   --  11.1* 10.6*  MG  --   --  2.1  --  1.9    GFR: Estimated Creatinine Clearance: 51.8 mL/min (by C-G formula based on SCr of 0.95 mg/dL). Liver Function Tests: Recent Labs  Lab 09/25/17 0949 09/26/17 0547  AST 84* 80*  ALT 21 20  ALKPHOS 120 106  BILITOT 0.9 0.7  PROT 6.9 6.0*  ALBUMIN 3.4* 3.1*   No results for input(s): LIPASE, AMYLASE in the last 168 hours. No results for input(s): AMMONIA in the last 168 hours. Coagulation Profile: Recent Labs  Lab 09/25/17 0949  INR 0.99   Cardiac Enzymes: No results for input(s): CKTOTAL, CKMB, CKMBINDEX, TROPONINI in the last 168 hours. BNP (last 3 results) No results for input(s): PROBNP in the last 8760 hours. HbA1C: No results for input(s): HGBA1C in the last 72 hours. CBG: No results for input(s): GLUCAP in the last 168 hours. Lipid Profile: No results for input(s): CHOL, HDL, LDLCALC, TRIG, CHOLHDL, LDLDIRECT in the last 72 hours. Thyroid Function Tests: Recent Labs    09/25/17 1805  TSH 2.916   Anemia Panel: Recent Labs    09/25/17 1806 09/26/17 0547  VITAMINB12 554  --   FOLATE  --  8.9  FERRITIN  --  148  TIBC  --  315  IRON  --  62   Sepsis Labs: Recent Labs  Lab 09/25/17 1048  LATICACIDVEN 1.75    Recent Results (from the past 240 hour(s))  Urine culture     Status: Abnormal   Collection Time: 09/25/17  9:49 AM  Result Value Ref Range Status   Specimen Description   Final    URINE, CLEAN CATCH Performed at [facility], [place] [address], [place], [place] [address]    Special Requests   Final    NONE Performed at [facility], [place] [address], [place], [place] [address]    Culture MULTIPLE SPECIES PRESENT, SUGGEST RECOLLECTION (A)  Final   Report Status 09/26/2017 FINAL  Final         Radiology Studies: Dg Chest 2 View  Result Date: 09/25/2017 CLINICAL DATA:  Confusion EXAM: CHEST - 2 VIEW COMPARISON:  February 10, 2014 FINDINGS: There is no edema or consolidation. The heart size and pulmonary  vascularity are normal. No adenopathy. No evident bone lesions. There is aortic atherosclerosis. IMPRESSION: No edema or consolidation.  There is aortic atherosclerosis. Aortic Atherosclerosis (ICD10-I70.0). Electronically Signed   By: Lowella Grip III M.D.   On: 09/25/2017 12:19   Ct Head Wo Contrast  Result Date: 09/25/2017 CLINICAL DATA:  Confusion EXAM: CT HEAD WITHOUT CONTRAST TECHNIQUE: Contiguous axial images were obtained from the base of the skull through the vertex without intravenous contrast. COMPARISON:  02/12/2014 FINDINGS: Brain: Atrophic changes and chronic white matter ischemic change is seen. No findings to suggest acute hemorrhage, acute infarction or space-occupying mass lesion  are noted. Vascular: No hyperdense vessel or unexpected calcification. Skull: Normal. Negative for fracture or focal lesion. Sinuses/Orbits: No acute finding. Other: None. IMPRESSION: Chronic atrophic and ischemic changes without acute abnormality. Electronically Signed   By: Inez Catalina M.D.   On: 09/25/2017 14:17        Scheduled Meds: . amLODipine  10 mg Oral Daily  . aspirin EC  81 mg Oral Daily  . buPROPion  150 mg Oral Daily  . diclofenac sodium  2 g Topical QID  . enoxaparin (LOVENOX) injection  40 mg Subcutaneous Q24H  . hydrochlorothiazide  12.5 mg Oral Daily  . metoprolol succinate  50 mg Oral Daily  . mirtazapine  30 mg Oral QHS  . QUEtiapine  100 mg Oral QHS   Continuous Infusions: . cefTRIAXone (ROCEPHIN)  IV 1 g (09/26/17 0949)  . lactated ringers 100 mL/hr at 09/26/17 0600     LOS: 0 days     Cordelia Poche, MD [facility] 09/26/2017, 9:55 AM Pager: [phone]  If 7PM-7AM, please contact night-coverage [email or web address] 09/26/2017, 9:55 AM

## 2017-09-26 NOTE — Care Management Note (Signed)
Case Management Note  Patient Details  Name: Monique Rush MRN: [id number] Date of Birth: Oct 12, 1940  Subjective/Objective: Patient unable to answer d/t  current confusion.Spoke to Jeff(son) about observation status-he voiced understanding, also he now agrees to SNF-MD/CSW notified.                    Action/Plan:d/c SNF.   Expected Discharge Date:  (unknown)               Expected Discharge Plan:  Skilled Nursing Facility  In-House Referral:  Clinical Social Work  Discharge planning Services  CM Consult  Post Acute Care Choice:    Choice offered to:     DME Arranged:    DME Agency:     HH Arranged:    [place] Agency:     Status of Service:  In process, will continue to follow  If discussed at Long Length of Stay Meetings, dates discussed:    Additional Comments:  Dessa Phi, RN 09/26/2017, 4:01 PM

## 2017-09-26 NOTE — Progress Notes (Signed)
CSW spoke with patient's son regarding PT recommendation for SNF. Patient currently oriented x self and unable to discuss PT recommendation for SNF. Patient's son declined SNF. Patient's son reported that patient has been in the past and did not have a good experience. Patient's son reported that he lives close to patient and that someone will be able to be with her at all times. Patient's son agreeable to HHPT, CSW agreed to notify patient's RNCM.   CSW notified patient's RNCM.  CSW signing off, no other needs identified at this time.  Abundio Miu, [place] Social Worker [facility] Cell#: [phone]

## 2017-09-27 ENCOUNTER — Inpatient Hospital Stay (HOSPITAL_COMMUNITY): Payer: Medicare Other

## 2017-09-27 LAB — BASIC METABOLIC PANEL
ANION GAP: 12 (ref 5–15)
BUN: 18 mg/dL (ref 8–23)
CHLORIDE: 96 mmol/L — AB (ref 98–111)
CO2: 31 mmol/L (ref 22–32)
Calcium: 10.1 mg/dL (ref 8.9–10.3)
Creatinine, Ser: 0.91 mg/dL (ref 0.44–1.00)
GFR calc Af Amer: >60 mL/min (ref >60)
GFR, EST NON AFRICAN AMERICAN: 59 mL/min — AB (ref >60)
GLUCOSE: 158 mg/dL — AB (ref 70–99)
Potassium: 3.3 mmol/L — ABNORMAL LOW (ref 3.5–5.1)
SODIUM: 139 mmol/L (ref 135–145)

## 2017-09-27 LAB — CBC
HCT: 30 % — ABNORMAL LOW (ref 36.0–46.0)
HEMOGLOBIN: 9.7 g/dL — AB (ref 12.0–15.0)
MCH: 27.8 pg (ref 26.0–34.0)
MCHC: 32.3 g/dL (ref 30.0–36.0)
MCV: 86 fL (ref 78.0–100.0)
Platelets: 203 10*3/uL (ref 150–400)
RBC: 3.49 MIL/uL — ABNORMAL LOW (ref 3.87–5.11)
RDW: 15.8 % — ABNORMAL HIGH (ref 11.5–15.5)
WBC: 8.5 10*3/uL (ref 4.0–10.5)

## 2017-09-27 MED ORDER — POLYETHYLENE GLYCOL 3350 17 G PO PACK
17.0000 g | PACK | Freq: Every day | ORAL | Status: DC
Start: 2017-09-27 — End: 2017-10-01
  Administered 2017-09-27 – 2017-10-01 (×5): 17 g via ORAL
  Filled 2017-09-27 (×4): qty 1

## 2017-09-27 NOTE — NC FL2 (Signed)
[place] LEVEL OF CARE SCREENING TOOL     IDENTIFICATION  Patient Name: Monique Rush Birthdate: [birth year] Sex: female Admission Date (Current Location): 09/25/2017  [facility] [place] Number:  Herbalist and Address:  [facility],  [place], [place]      Provider Number: [id number]  Attending Physician Name and Address:  Mariel Aloe, MD  Relative Name and Phone Number:       Current Level of Care: Hospital Recommended Level of Care: [place] Prior Approval Number:    Date Approved/Denied:   PASRR Number: [id number]  Discharge Plan: SNF    Current Diagnoses: Patient Active Problem List   Diagnosis Date Noted  . Acute metabolic encephalopathy 14/48/1856  . Urinary tract infection without hematuria   . Fracture of femoral neck, left ([place]) 02/12/2014  . PRES (posterior reversible encephalopathy syndrome) 02/11/2014  . Encephalopathy 02/11/2014  . Leukocytosis 02/11/2014  . Dehydration 02/11/2014    Orientation RESPIRATION BLADDER Height & Weight     Self  Normal Continent Weight: 153 lb 3.5 oz (69.5 kg) Height:  5\' 9"  (175.3 cm)  BEHAVIORAL SYMPTOMS/MOOD NEUROLOGICAL BOWEL NUTRITION STATUS        Diet(see dc summary)  AMBULATORY STATUS COMMUNICATION OF NEEDS Skin     Verbally Normal                       Personal Care Assistance Level of Assistance  Bathing, Feeding, Dressing Bathing Assistance: Limited assistance Feeding assistance: Independent Dressing Assistance: Limited assistance     Functional Limitations Info  Hearing   Hearing Info: Impaired      SPECIAL CARE FACTORS FREQUENCY  PT (By licensed PT), OT (By licensed OT)     PT Frequency: 5x/week OT Frequency: 5x/week            Contractures      Additional Factors Info  Code Status, Allergies Code Status Info: Full Code Allergies Info: Morphine And Related;Oxycontin Oxycodone Hcl;Percocet  Oxycodone-acetaminophen;           Current Medications (09/27/2017):  This is the current hospital active medication list Current Facility-Administered Medications  Medication Dose Route Frequency Provider Last Rate Last Dose  . acetaminophen (TYLENOL) tablet 650 mg  650 mg Oral Q6H PRN Elodia Florence., MD   650 mg at 09/25/17 1851  . amLODipine (NORVASC) tablet 10 mg  10 mg Oral Daily Elodia Florence., MD   10 mg at 09/27/17 0936  . aspirin EC tablet 81 mg  81 mg Oral Daily Elodia Florence., MD   81 mg at 09/27/17 3149  . cefTRIAXone (ROCEPHIN) 1 g in sodium chloride 0.9 % 100 mL IVPB  1 g Intravenous Q24H Elodia Florence., MD 200 mL/hr at 09/26/17 0949 1 g at 09/26/17 0949  . diclofenac sodium (VOLTAREN) 1 % transdermal gel 2 g  2 g Topical QID Elodia Florence., MD   2 g at 09/27/17 [phone]  . enoxaparin (LOVENOX) injection 40 mg  40 mg Subcutaneous Q24H Elodia Florence., MD   40 mg at 09/26/17 2116  . feeding supplement (ENSURE ENLIVE) (ENSURE ENLIVE) liquid 237 mL  237 mL Oral BID BM Mariel Aloe, MD   237 mL at 09/27/17 0948  . hydrALAZINE (APRESOLINE) injection 5 mg  5 mg Intravenous Q6H PRN Mariel Aloe, MD      . hydrochlorothiazide (HYDRODIURIL) tablet  12.5 mg  12.5 mg Oral Daily Elodia Florence., MD   12.5 mg at 09/27/17 7425  . metoprolol succinate (TOPROL-XL) 24 hr tablet 50 mg  50 mg Oral Daily Elodia Florence., MD   50 mg at 09/27/17 0936  . mirtazapine (REMERON) tablet 30 mg  30 mg Oral QHS Elodia Florence., MD   30 mg at 09/26/17 2116  . QUEtiapine (SEROQUEL) tablet 100 mg  100 mg Oral QHS Elodia Florence., MD   100 mg at 09/26/17 2116     Discharge Medications: Please see discharge summary for a list of discharge medications.  Relevant Imaging Results:  Relevant Lab Results:   Additional Information SSN [id number]  Burnis Medin, LCSW

## 2017-09-27 NOTE — Progress Notes (Signed)
Physical Therapy Treatment Patient Details Name: Monique Rush MRN: [id number] DOB: 16-Aug-1940 Today's Date: 09/27/2017    History of Present Illness 77 yo female admitted with acute met encephalopathy, hypokalemia, UTI. Hx of L THA 2015, dementia, falls    PT Comments    Pt continues to participate well. She remains weak and fatigues fairly easily especially during ambulation. No family present during session. Will continue to follow and progress activity.    Follow Up Recommendations  SNF     Equipment Recommendations  Rolling walker with 5" wheels    Recommendations for Other Services       Precautions / Restrictions Precautions Precautions: Fall Restrictions Weight Bearing Restrictions: No    Mobility  Bed Mobility               General bed mobility comments: oob in recliner  Transfers Overall transfer level: Needs assistance Equipment used: Rolling walker (2 wheeled) Transfers: Sit to/from Stand Sit to Stand: Min assist         General transfer comment: VCs safety. Assist to stabilize, control descent. Again, pt attempts to sit before safely positioned.   Ambulation/Gait Ambulation/Gait assistance: Min assist Gait Distance (Feet): 48 Feet Assistive device: Rolling walker (2 wheeled) Gait Pattern/deviations: Step-through pattern;Trunk flexed     General Gait Details: Assist to stabilize pt and to maneuver safely with RW throughout distance. Multimodal cueing required. Pt is unsteady and at high risk for falls. Fatigues easily.    Stairs             Wheelchair Mobility    Modified Rankin (Stroke Patients Only)       Balance           Standing balance support: Bilateral upper extremity supported Standing balance-Leahy Scale: Poor Standing balance comment: RW required at this time                            Cognition Arousal/Alertness: Awake/alert Behavior During Therapy: WFL for tasks assessed/performed Overall  Cognitive Status: History of cognitive impairments - at baseline                                        Exercises      General Comments        Pertinent Vitals/Pain Pain Assessment: No/denies pain    Home Living                      Prior Function            PT Goals (current goals can now be found in the care plan section) Progress towards PT goals: Progressing toward goals    Frequency    Min 3X/week(notes for [employer])      PT Plan Current plan remains appropriate    Co-evaluation              AM-PAC PT "6 Clicks" Daily Activity  Outcome Measure  Difficulty turning over in bed (including adjusting bedclothes, sheets and blankets)?: A Lot Difficulty moving from lying on back to sitting on the side of the bed? : Unable Difficulty sitting down on and standing up from a chair with arms (e.g., wheelchair, bedside commode, etc,.)?: Unable Help needed moving to and from a bed to chair (including a wheelchair)?: A Little Help needed walking in hospital room?: A Little Help  needed climbing 3-5 steps with a railing? : A Lot 6 Click Score: 12    End of Session Equipment Utilized During Treatment: Gait belt Activity Tolerance: Patient limited by fatigue Patient left: in chair;with call bell/phone within reach;with chair alarm set(telesitter)   PT Visit Diagnosis: Muscle weakness (generalized) (M62.81);Difficulty in walking, not elsewhere classified (R26.2);History of falling (Z91.81)     Time: 0254-8628 PT Time Calculation (min) (ACUTE ONLY): 11 min  Charges:  $Gait Training: 8-22 mins                       Weston Anna, MPT Pager: [phone]

## 2017-09-27 NOTE — Progress Notes (Signed)
Pt with severe kyphosis of thoracic spine region. Pt was attempted three times. multiple arrangements were made to assist pt with laying flat. Pt unable to lay flat on hard surface. Pt immediately sits up when attempting to lay her flat. Pt is unable to lay past 30 degrees when on a hard surface.

## 2017-09-27 NOTE — Progress Notes (Addendum)
CSW followed up with patient's son regarding discharge planning and interest in SNF. Patient's son confirmed that he is now interested in patient discharging to SNF. CSW agreed to complete FL2 and follow up with bed offers.  CSW completed FL2.  CSW initiated patient's insurance authorization Nurse, mental health Medicare).  CSW will continue to follow and assist with discharge planning.  11:13AM CSW provided patient's son with SNF bed offers, patient's son reported that he would think about and call CSW back.  Abundio Miu, [place] Social Worker [facility] Cell#: [phone]

## 2017-09-27 NOTE — Progress Notes (Signed)
PROGRESS NOTE    Monique Rush  PJA:[id number] DOB: 02/06/1941 DOA: 09/25/2017 PCP: Benito Mccreedy, MD   Brief Narrative: Monique Rush is a 77 y.o. female with medical history significant of HTN, dementia. Patient presented with altered mental status, thought secondary to infection vs hypercalcemia. History of PRES with elevated blood pressure on admission. CT head unremarkable.   Assessment & Plan:   Active Problems:   Acute metabolic encephalopathy   Urinary tract infection without hematuria   Acute metabolic encephalopathy Thought to be secondary to infection. Unable to get in touch with son today, but per report, patient is more oriented at baseline and functional (driving, house work, Social research officer, government). Patient oriented to person and place only. Not oriented to situation. Urine culture unhelpful. Blood culture no growth to date. CT head unremarkable for acute process. History of PRES.  -Blood culture pending -Continue Ceftriaxone for empiric UTI treatment -MRI brain  Hypercalcemia Calcium improved with IV fluids. May be exacerbated by hydrochlorothiazide -Repeat BMP  Hypokalemia Resolved with repletion. Magnesium normal.  Anemia Last hemoglobin from 2 years ago. Stable this admission. Anemia panel unremarkable. No active bleeding.  Tachycardia Sinus. Improved with IV fluids  LE edema No DVT on LE duplex  Essential hypertension Not well controlled -Continue amlodipine (added), hydrochlorothiazide, metoprolol -Hydralazine prn  Depression/Insomnia -Continue Seroquel and Remeron  Dementia Not on medications as an outpatient. Previously on Aricept. ?vascular  Elevated AST Mild. Trended down slightly.   DVT prophylaxis: Lovenox Code Status:   Code Status: Full Code Family Communication: Son on telephone Disposition Plan: Discharge when mental status improved if possible   Consultants:   None  Procedures:   None  Antimicrobials:  Ceftriaxone     Subjective: No issues  Objective: Vitals:   09/26/17 2138 09/27/17 0356 09/27/17 0935 09/27/17 1230  BP: 128/74 (!) 152/77 (!) 146/74 123/74  Pulse: 99 97  100  Resp: 18 16 18 18   Temp: 98.2 F (36.8 C) 97.6 F (36.4 C)    TempSrc: Oral Oral    SpO2: 95% 93%  92%  Weight:      Height:        Intake/Output Summary (Last 24 hours) at 09/27/2017 1232 Last data filed at 09/27/2017 0451 Gross per 24 hour  Intake 100 ml  Output 1600 ml  Net -1500 ml   Filed Weights   09/25/17 0938 09/25/17 1648 09/26/17 0443  Weight: 74.8 kg 69.2 kg 69.5 kg    Examination:  General exam: Appears calm and comfortable  Respiratory system: Clear to auscultation. Respiratory effort normal. Cardiovascular system: S1 & S2 heard, RRR. No murmurs, rubs, gallops or clicks. Gastrointestinal system: Abdomen is nondistended, soft and nontender. No organomegaly or masses felt. Normal bowel sounds heard. Central nervous system: Alert and oriented to person. No focal neurological deficits. Extremities: No calf tenderness Skin: No cyanosis. No rashes Psychiatry: Judgement and insight appear impaired. Flat affect    Data Reviewed: I have personally reviewed following labs and imaging studies  CBC: Recent Labs  Lab 09/25/17 0949 09/25/17 1800 09/26/17 0547  WBC 8.2 10.1 8.2  NEUTROABS 5.7  --   --   HGB 9.7* 10.1* 9.0*  HCT 30.4* 31.9* 28.1*  MCV 87.4 87.2 87.3  PLT 197 192 341   Basic Metabolic Panel: Recent Labs  Lab 09/25/17 0949 09/25/17 1800 09/25/17 1805 09/25/17 1837 09/26/17 0547  NA 146*  --   --   --  141  K 2.7*  --   --   --  3.5  CL 101  --   --   --  99  CO2 31  --   --   --  29  GLUCOSE 131*  --   --   --  106*  BUN 20  --   --   --  13  CREATININE 1.01* 0.89  --   --  0.95  CALCIUM 12.2*  --   --  11.1* 10.6*  MG  --   --  2.1  --  1.9   GFR: Estimated Creatinine Clearance: 51.8 mL/min (by C-G formula based on SCr of 0.95 mg/dL). Liver Function Tests: Recent  Labs  Lab 09/25/17 0949 09/26/17 0547  AST 84* 80*  ALT 21 20  ALKPHOS 120 106  BILITOT 0.9 0.7  PROT 6.9 6.0*  ALBUMIN 3.4* 3.1*   No results for input(s): LIPASE, AMYLASE in the last 168 hours. Recent Labs  Lab 09/26/17 1011  AMMONIA 10   Coagulation Profile: Recent Labs  Lab 09/25/17 0949  INR 0.99   Cardiac Enzymes: No results for input(s): CKTOTAL, CKMB, CKMBINDEX, TROPONINI in the last 168 hours. BNP (last 3 results) No results for input(s): PROBNP in the last 8760 hours. HbA1C: No results for input(s): HGBA1C in the last 72 hours. CBG: No results for input(s): GLUCAP in the last 168 hours. Lipid Profile: No results for input(s): CHOL, HDL, LDLCALC, TRIG, CHOLHDL, LDLDIRECT in the last 72 hours. Thyroid Function Tests: Recent Labs    09/25/17 1805  TSH 2.916   Anemia Panel: Recent Labs    09/25/17 1806 09/26/17 0547  VITAMINB12 554  --   FOLATE  --  8.9  FERRITIN  --  148  TIBC  --  315  IRON  --  62   Sepsis Labs: Recent Labs  Lab 09/25/17 1048  LATICACIDVEN 1.75    Recent Results (from the past 240 hour(s))  Culture, blood (Routine x 2)     Status: None (Preliminary result)   Collection Time: 09/25/17  9:49 AM  Result Value Ref Range Status   Specimen Description   Final    BLOOD RIGHT FOREARM Performed at [facility] [address] [place], [place] [address]    Special Requests   Final    BOTTLES DRAWN AEROBIC AND ANAEROBIC Blood Culture results may not be optimal due to an excessive volume of blood received in culture bottles Performed at [facility] [address], [place], [place] [address]    Culture   Final    NO GROWTH 1 DAY Performed at [facility], [place] [address] [place], [place] [address]    Report Status PENDING  Incomplete  Urine culture     Status: Abnormal   Collection Time: 09/25/17  9:49 AM  Result Value Ref Range Status   Specimen Description   Final    URINE,  CLEAN CATCH Performed at [facility], [place] [address] [place], [place] [address]    Special Requests   Final    NONE Performed at [facility], [place] [address] [place],  [address]    Culture MULTIPLE SPECIES PRESENT, SUGGEST RECOLLECTION (A)  Final   Report Status 09/26/2017 FINAL  Final  Culture, blood (Routine x 2)     Status: None (Preliminary result)   Collection Time: 09/25/17  9:54 AM  Result Value Ref Range Status   Specimen Description   Final    BLOOD LEFT FOREARM Performed at [facility]  [place] [address] [place], [place] [address]    Special Requests   Final    BOTTLES DRAWN AEROBIC AND ANAEROBIC Blood Culture adequate volume Performed at [facility] [address], [place], [place] [address]    Culture   Final    NO GROWTH 1 DAY Performed at [facility], [place] [address] [place],  [address]    Report Status PENDING  Incomplete         Radiology Studies: Ct Head Wo Contrast  Result Date: 09/25/2017 CLINICAL DATA:  Confusion EXAM: CT HEAD WITHOUT CONTRAST TECHNIQUE: Contiguous axial images were obtained from the base of the skull through the vertex without intravenous contrast. COMPARISON:  02/12/2014 FINDINGS: Brain: Atrophic changes and chronic white matter ischemic change is seen. No findings to suggest acute hemorrhage, acute infarction or space-occupying mass lesion are noted. Vascular: No hyperdense vessel or unexpected calcification. Skull: Normal. Negative for fracture or focal lesion. Sinuses/Orbits: No acute finding. Other: None. IMPRESSION: Chronic atrophic and ischemic changes without acute abnormality. Electronically Signed   By: Inez Catalina M.D.   On: 09/25/2017 14:17        Scheduled Meds: . amLODipine  10 mg Oral Daily  . aspirin EC  81 mg Oral Daily  . diclofenac sodium  2 g Topical QID  . enoxaparin (LOVENOX) injection  40 mg Subcutaneous Q24H  .  feeding supplement (ENSURE ENLIVE)  237 mL Oral BID BM  . hydrochlorothiazide  12.5 mg Oral Daily  . metoprolol succinate  50 mg Oral Daily  . mirtazapine  30 mg Oral QHS  . QUEtiapine  100 mg Oral QHS   Continuous Infusions: . cefTRIAXone (ROCEPHIN)  IV 1 g (09/27/17 1058)     LOS: 1 day     Cordelia Poche, MD [facility] 09/27/2017, 12:32 PM Pager: [phone]  If 7PM-7AM, please contact night-coverage [email or web address] 09/27/2017, 12:32 PM

## 2017-09-28 MED ORDER — POTASSIUM CHLORIDE CRYS ER 20 MEQ PO TBCR
40.0000 meq | EXTENDED_RELEASE_TABLET | Freq: Once | ORAL | Status: AC
  Administered 2017-09-28: 40 meq via ORAL
  Filled 2017-09-28: qty 2

## 2017-09-28 NOTE — Progress Notes (Signed)
PHARMACY - PHYSICIAN COMMUNICATION CRITICAL VALUE ALERT - BLOOD CULTURE IDENTIFICATION (BCID)  Monique Rush is an 77 y.o. female who presented to [facility] on 09/25/2017 with a chief complaint of with altered mental status, thought secondary to infection vs hypercalcemia.   Name of physician (or Provider) Contacted: Raliegh Ip Schorr  Current antibiotics: ceftriaxone  Changes to prescribed antibiotics recommended:  none  No results found for this or any previous visit.  Dolly Rias RPh 09/28/2017, 7:40 PM Pager [phone]

## 2017-09-28 NOTE — Progress Notes (Signed)
Pt woke up agitated and yelling. Pt stated she was having a bad dream. This RN noticed that the pt appeared more drowsy than the nights before. Pt was able to speak a few words then fell asleep. Vitals obtained. 02 saturation found to be 88%, 2L [place] applied and O2 stats improved to 97. Will continue to monitor closely.

## 2017-09-28 NOTE — Progress Notes (Signed)
CSW spoke with patient's son regarding SNF selection, patient's son reported that the SNF selection is [facility].   CSW contacted [facility], staff member Claiborne Billings confirmed bed offer.  CSW contacted [facility]) and provided SNF selection. Patient received insurance authorization. CSW provided details to [facility].   Auth # [id number] Start Date 8/10 Review Date 8/12 543 therapy minutes [place] [facility]  CSW will continue to follow and assist with discharge planning.   Abundio Miu, [place] Social Worker [facility] Cell#: [phone]

## 2017-09-28 NOTE — Progress Notes (Signed)
PROGRESS NOTE    Monique Rush  EHM:[id number] DOB: 06/19/1940 DOA: 09/25/2017 PCP: Benito Mccreedy, MD   Brief Narrative: Monique Rush is a 77 y.o. female with medical history significant of HTN, dementia. Patient presented with altered mental status, thought secondary to infection vs hypercalcemia. History of PRES with elevated blood pressure on admission. CT head unremarkable.   Assessment & Plan:   Active Problems:   Acute metabolic encephalopathy   Urinary tract infection without hematuria   Acute metabolic encephalopathy Thought to be secondary to infection. Unable to get in touch with son today, but per report, patient is more oriented at baseline and functional (driving, house work, Social research officer, government). Patient oriented to person and place only. Not oriented to situation. Urine culture unhelpful. Blood culture no growth to date. CT head unremarkable for acute process. History of PRES. Discussed with PCP and patient, back in March, was oriented only to person and place. Patient did not tolerate MRI. Discussed with neurology and recommended ionized calcium and EEG. -Blood culture (final) -Continue Ceftriaxone for empiric UTI treatment  Hypercalcemia Calcium improved with IV fluids. May be exacerbated by hydrochlorothiazide, however, it is improved now. -Repeat BMP  Hypokalemia Resolved with repletion. Magnesium normal.  Anemia Last hemoglobin from 2 years ago. Stable this admission. Anemia panel unremarkable. No active bleeding.  Tachycardia Sinus. Improved with IV fluids  LE edema No DVT on LE duplex  Essential hypertension Not well controlled -Continue amlodipine (added), metoprolol -Hydralazine prn -Discontinue hydrochlorothiazide  Depression/Insomnia -Continue Seroquel and Remeron  Dementia Not on medications as an outpatient. Previously on Aricept. ?vascular  Elevated AST Mild. Trended down slightly.   DVT prophylaxis: Lovenox Code Status:   Code Status: Full  Code Family Communication: None at bedside Disposition Plan: Discharge when mental status improved or if workup negative   Consultants:   Neurology (telephone)  Procedures:   None  Antimicrobials:  Ceftriaxone    Subjective: Some back pain  Objective: Vitals:   09/27/17 2345 09/28/17 0434 09/28/17 0936 09/28/17 1327  BP: (!) 149/76 127/69 125/62 127/68  Pulse: (!) 108 95 88 94  Resp:  18  16  Temp:  (!) 97.5 F (36.4 C)  98.2 F (36.8 C)  TempSrc:  Oral  Oral  SpO2: 97% 98%  92%  Weight:      Height:        Intake/Output Summary (Last 24 hours) at 09/28/2017 1442 Last data filed at 09/28/2017 1059 Gross per 24 hour  Intake 320 ml  Output -  Net 320 ml   Filed Weights   09/25/17 0938 09/25/17 1648 09/26/17 0443  Weight: 74.8 kg 69.2 kg 69.5 kg    Examination:  General exam: Appears calm and comfortable Respiratory system: Clear to auscultation. Respiratory effort normal. Cardiovascular system: S1 & S2 heard, RRR. No murmurs, rubs, gallops or clicks. Gastrointestinal system: Abdomen is nondistended, soft and nontender. Normal bowel sounds heard. Central nervous system: Alert and oriented to person. No focal neurological deficits. Extremities: No edema. No calf tenderness Skin: No cyanosis. No rashes Psychiatry: Judgement and insight appear impaired. Mood & affect appropriate.     Data Reviewed: I have personally reviewed following labs and imaging studies  CBC: Recent Labs  Lab 09/25/17 0949 09/25/17 1800 09/26/17 0547 09/27/17 1301  WBC 8.2 10.1 8.2 8.5  NEUTROABS 5.7  --   --   --   HGB 9.7* 10.1* 9.0* 9.7*  HCT 30.4* 31.9* 28.1* 30.0*  MCV 87.4 87.2 87.3 86.0  PLT 197  192 212 177   Basic Metabolic Panel: Recent Labs  Lab 09/25/17 0949 09/25/17 1800 09/25/17 1805 09/25/17 1837 09/26/17 0547 09/27/17 1301  NA 146*  --   --   --  141 139  K 2.7*  --   --   --  3.5 3.3*  CL 101  --   --   --  99 96*  CO2 31  --   --   --  29 31    GLUCOSE 131*  --   --   --  106* 158*  BUN 20  --   --   --  13 18  CREATININE 1.01* 0.89  --   --  0.95 0.91  CALCIUM 12.2*  --   --  11.1* 10.6* 10.1  MG  --   --  2.1  --  1.9  --    GFR: Estimated Creatinine Clearance: 54.1 mL/min (by C-G formula based on SCr of 0.91 mg/dL). Liver Function Tests: Recent Labs  Lab 09/25/17 0949 09/26/17 0547  AST 84* 80*  ALT 21 20  ALKPHOS 120 106  BILITOT 0.9 0.7  PROT 6.9 6.0*  ALBUMIN 3.4* 3.1*   No results for input(s): LIPASE, AMYLASE in the last 168 hours. Recent Labs  Lab 09/26/17 1011  AMMONIA 10   Coagulation Profile: Recent Labs  Lab 09/25/17 0949  INR 0.99   Cardiac Enzymes: No results for input(s): CKTOTAL, CKMB, CKMBINDEX, TROPONINI in the last 168 hours. BNP (last 3 results) No results for input(s): PROBNP in the last 8760 hours. HbA1C: No results for input(s): HGBA1C in the last 72 hours. CBG: No results for input(s): GLUCAP in the last 168 hours. Lipid Profile: No results for input(s): CHOL, HDL, LDLCALC, TRIG, CHOLHDL, LDLDIRECT in the last 72 hours. Thyroid Function Tests: Recent Labs    09/25/17 1805  TSH 2.916   Anemia Panel: Recent Labs    09/25/17 1806 09/26/17 0547  VITAMINB12 554  --   FOLATE  --  8.9  FERRITIN  --  148  TIBC  --  315  IRON  --  62   Sepsis Labs: Recent Labs  Lab 09/25/17 1048  LATICACIDVEN 1.75    Recent Results (from the past 240 hour(s))  Culture, blood (Routine x 2)     Status: None (Preliminary result)   Collection Time: 09/25/17  9:49 AM  Result Value Ref Range Status   Specimen Description   Final    BLOOD RIGHT FOREARM Performed at [facility] [address], [place], [place] [address]    Special Requests   Final    BOTTLES DRAWN AEROBIC AND ANAEROBIC Blood Culture results may not be optimal due to an excessive volume of blood received in culture bottles Performed at [place] [address] [place],  [place] [address]    Culture   Final    NO GROWTH 3 DAYS Performed at [facility], [place] [address], [place], [place] [address]    Report Status PENDING  Incomplete  Urine culture     Status: Abnormal   Collection Time: 09/25/17  9:49 AM  Result Value Ref Range Status   Specimen Description   Final    URINE, CLEAN CATCH Performed at [facility], [place] [address] [place], [place] [address]    Special Requests   Final    NONE Performed at [facility], [place] [address], [place], [place] [address]  Culture MULTIPLE SPECIES PRESENT, SUGGEST RECOLLECTION (A)  Final   Report Status 09/26/2017 FINAL  Final  Culture, blood (Routine x 2)     Status: None (Preliminary result)   Collection Time: 09/25/17  9:54 AM  Result Value Ref Range Status   Specimen Description   Final    BLOOD LEFT FOREARM Performed at [facility] [address] [place], [place] [address]    Special Requests   Final    BOTTLES DRAWN AEROBIC AND ANAEROBIC Blood Culture adequate volume Performed at [place] [address] [place], [place] [address]    Culture   Final    NO GROWTH 3 DAYS Performed at [facility], [place] [address], [place], [place] [address]    Report Status PENDING  Incomplete         Radiology Studies: No results found.      Scheduled Meds: . amLODipine  10 mg Oral Daily  . aspirin EC  81 mg Oral Daily  . diclofenac sodium  2 g Topical QID  . enoxaparin (LOVENOX) injection  40 mg Subcutaneous Q24H  . feeding supplement (ENSURE ENLIVE)  237 mL Oral BID BM  . hydrochlorothiazide  12.5 mg Oral Daily  . metoprolol succinate  50 mg Oral Daily  . mirtazapine  30 mg Oral QHS  . polyethylene glycol  17 g Oral Daily  . QUEtiapine  100 mg Oral QHS   Continuous Infusions: . cefTRIAXone (ROCEPHIN)  IV Stopped (09/28/17 1016)     LOS: 2 days     Cordelia Poche, MD [facility] 09/28/2017, 2:42  PM Pager: [phone]  If 7PM-7AM, please contact night-coverage [email or web address] 09/28/2017, 2:42 PM

## 2017-09-28 NOTE — Progress Notes (Signed)
CSW contacted patient's son to follow up regarding SNF selection, no answer. CSW left voicemail requesting return phone call. SNF selection is needed to complete insurance authorization for SNF. CSW will continue to try to reach patient's son.  Abundio Miu, [place] Social Worker [facility] Cell#: [phone]

## 2017-09-29 ENCOUNTER — Inpatient Hospital Stay (HOSPITAL_COMMUNITY): Payer: Medicare Other

## 2017-09-29 LAB — BASIC METABOLIC PANEL
Anion gap: 14 (ref 5–15)
BUN: 23 mg/dL (ref 8–23)
CALCIUM: 10.5 mg/dL — AB (ref 8.9–10.3)
CHLORIDE: 100 mmol/L (ref 98–111)
CO2: 29 mmol/L (ref 22–32)
CREATININE: 1.08 mg/dL — AB (ref 0.44–1.00)
GFR calc Af Amer: 56 mL/min — ABNORMAL LOW (ref >60)
GFR calc non Af Amer: 48 mL/min — ABNORMAL LOW (ref >60)
Glucose, Bld: 103 mg/dL — ABNORMAL HIGH (ref 70–99)
Potassium: 3.9 mmol/L (ref 3.5–5.1)
SODIUM: 143 mmol/L (ref 135–145)

## 2017-09-29 LAB — CALCIUM, IONIZED: Calcium, Ionized, Serum: 5.8 mg/dL — ABNORMAL HIGH (ref 4.5–5.6)

## 2017-09-29 LAB — CULTURE, BLOOD (ROUTINE X 2): Special Requests: ADEQUATE

## 2017-09-29 MED ORDER — SODIUM CHLORIDE 0.9 % IV SOLN
INTRAVENOUS | Status: DC
  Administered 2017-09-29 – 2017-10-01 (×3): via INTRAVENOUS

## 2017-09-29 NOTE — Progress Notes (Signed)
CSW made aware by RN, Raquel Sarna, that patient unable to d/c until Monday per MD. RN informed patient's daughter of plan for patient to d/c to [facility] SNF when appropriate. CSW informed staff at [facility] Claiborne Billings) that patient will not be d/c today.   CSW will continue to follow and assist with d/c.     Pricilla Holm, MSW, [place] Social Work [phone]

## 2017-09-29 NOTE — Progress Notes (Signed)
PROGRESS NOTE    Monique Rush  SUP:[id number] DOB: 1941/01/07 DOA: 09/25/2017 PCP: Benito Mccreedy, MD   Brief Narrative: Monique Rush is a 77 y.o. female with medical history significant of HTN, dementia. Patient presented with altered mental status, thought secondary to infection vs hypercalcemia. History of PRES with elevated blood pressure on admission. CT head unremarkable.   Assessment & Plan:   Active Problems:   Acute metabolic encephalopathy   Urinary tract infection without hematuria   Acute metabolic encephalopathy Thought to be secondary to infection. Unable to get in touch with son today, but per report, patient is more oriented at baseline and functional (driving, house work, Social research officer, government). Patient oriented to person and place only. Not oriented to situation. Urine culture unhelpful. CT head unremarkable for acute process. History of PRES. Discussed with PCP and patient, back in March, was oriented only to person and place. Patient did not tolerate MRI. Blood culture significant for gram positive rods. ?contaminent. -Blood culture (final) -Continue Ceftriaxone for empiric UTI treatment  Hypercalcemia Calcium improved with IV fluids. May be exacerbated by hydrochlorothiazide, however, it is improved now. -will give some IV fluids -Repeat BMP  Hypokalemia Resolved with repletion. Magnesium normal.  Anemia Last hemoglobin from 2 years ago. Stable this admission. Anemia panel unremarkable. No active bleeding.  Tachycardia Sinus. Improved with IV fluids  LE edema No DVT on LE duplex  Essential hypertension Well controlled -Continue amlodipine, metoprolol -Hydralazine prn -Discontinued hydrochlorothiazide in setting of hypercalcemia  Depression/Insomnia -Continue Seroquel and Remeron  Dementia Not on medications as an outpatient. Previously on Aricept. ?vascular  Elevated AST Mild. Trended down slightly.   DVT prophylaxis: Lovenox Code Status:   Code  Status: Full Code Family Communication: None at bedside Disposition Plan: Discharge when mental status improved or if workup negative   Consultants:   Neurology (telephone)  Procedures:   None  Antimicrobials:  Ceftriaxone    Subjective: No issues today.  Objective: Vitals:   09/28/17 0936 09/28/17 1327 09/28/17 2032 09/29/17 0500  BP: 125/62 127/68 (!) 150/67 129/75  Pulse: 88 94 97 (!) 101  Resp:  16 20 18   Temp:  98.2 F (36.8 C) 98.5 F (36.9 C) (!) 97.4 F (36.3 C)  TempSrc:  Oral Oral Oral  SpO2:  92% 94% 94%  Weight:      Height:        Intake/Output Summary (Last 24 hours) at 09/29/2017 1126 Last data filed at 09/28/2017 1500 Gross per 24 hour  Intake 120 ml  Output -  Net 120 ml   Filed Weights   09/25/17 0938 09/25/17 1648 09/26/17 0443  Weight: 74.8 kg 69.2 kg 69.5 kg    Examination:  General exam: Appears calm and comfortable Respiratory system: Clear to auscultation. Respiratory effort normal. Cardiovascular system: S1 & S2 heard, RRR. No murmurs, rubs, gallops or clicks. Gastrointestinal system: Abdomen is nondistended, soft and nontender. No organomegaly or masses felt. Normal bowel sounds heard. Central nervous system: Alert and oriented to person and place. No focal neurological deficits. Extremities: No edema. No calf tenderness Skin: No cyanosis. No rashes Psychiatry: Judgement and insight appear normal. Mood & affect appropriate.     Data Reviewed: I have personally reviewed following labs and imaging studies  CBC: Recent Labs  Lab 09/25/17 0949 09/25/17 1800 09/26/17 0547 09/27/17 1301  WBC 8.2 10.1 8.2 8.5  NEUTROABS 5.7  --   --   --   HGB 9.7* 10.1* 9.0* 9.7*  HCT 30.4* 31.9* 28.1*  30.0*  MCV 87.4 87.2 87.3 86.0  PLT 197 192 212 081   Basic Metabolic Panel: Recent Labs  Lab 09/25/17 0949 09/25/17 1800 09/25/17 1805 09/25/17 1837 09/26/17 0547 09/27/17 1301 09/29/17 0856  NA 146*  --   --   --  141 139 143  K  2.7*  --   --   --  3.5 3.3* 3.9  CL 101  --   --   --  99 96* 100  CO2 31  --   --   --  29 31 29   GLUCOSE 131*  --   --   --  106* 158* 103*  BUN 20  --   --   --  13 18 23   CREATININE 1.01* 0.89  --   --  0.95 0.91 1.08*  CALCIUM 12.2*  --   --  11.1* 10.6* 10.1 10.5*  MG  --   --  2.1  --  1.9  --   --    GFR: Estimated Creatinine Clearance: 45.6 mL/min (A) (by C-G formula based on SCr of 1.08 mg/dL (H)). Liver Function Tests: Recent Labs  Lab 09/25/17 0949 09/26/17 0547  AST 84* 80*  ALT 21 20  ALKPHOS 120 106  BILITOT 0.9 0.7  PROT 6.9 6.0*  ALBUMIN 3.4* 3.1*   No results for input(s): LIPASE, AMYLASE in the last 168 hours. Recent Labs  Lab 09/26/17 1011  AMMONIA 10   Coagulation Profile: Recent Labs  Lab 09/25/17 0949  INR 0.99   Cardiac Enzymes: No results for input(s): CKTOTAL, CKMB, CKMBINDEX, TROPONINI in the last 168 hours. BNP (last 3 results) No results for input(s): PROBNP in the last 8760 hours. HbA1C: No results for input(s): HGBA1C in the last 72 hours. CBG: No results for input(s): GLUCAP in the last 168 hours. Lipid Profile: No results for input(s): CHOL, HDL, LDLCALC, TRIG, CHOLHDL, LDLDIRECT in the last 72 hours. Thyroid Function Tests: No results for input(s): TSH, T4TOTAL, FREET4, T3FREE, THYROIDAB in the last 72 hours. Anemia Panel: No results for input(s): VITAMINB12, FOLATE, FERRITIN, TIBC, IRON, RETICCTPCT in the last 72 hours. Sepsis Labs: Recent Labs  Lab 09/25/17 1048  LATICACIDVEN 1.75    Recent Results (from the past 240 hour(s))  Culture, blood (Routine x 2)     Status: None (Preliminary result)   Collection Time: 09/25/17  9:49 AM  Result Value Ref Range Status   Specimen Description   Final    BLOOD RIGHT FOREARM Performed at [facility] [address], [place], [place] [address]    Special Requests   Final    BOTTLES DRAWN AEROBIC AND ANAEROBIC Blood Culture results may not be optimal due to an  excessive volume of blood received in culture bottles Performed at [facility] [address] [place], [place] [address]    Culture   Final    NO GROWTH 4 DAYS Performed at [facility], [place] [address] [place], [place] [address]    Report Status PENDING  Incomplete  Urine culture     Status: Abnormal   Collection Time: 09/25/17  9:49 AM  Result Value Ref Range Status   Specimen Description   Final    URINE, CLEAN CATCH Performed at [facility], [place] [address] [place], [place] [address]    Special Requests   Final    NONE Performed at [facility], [place] [address], [place], [place] [address]  Culture MULTIPLE SPECIES PRESENT, SUGGEST RECOLLECTION (A)  Final   Report Status 09/26/2017 FINAL  Final  Culture, blood (Routine x 2)     Status: None (Preliminary result)   Collection Time: 09/25/17  9:54 AM  Result Value Ref Range Status   Specimen Description   Final    BLOOD LEFT FOREARM Performed at [facility] [address] [place], [place] [address]    Special Requests   Final    BOTTLES DRAWN AEROBIC AND ANAEROBIC Blood Culture adequate volume Performed at [facility] [address], [place], [place] [address]    Culture  Setup Time   Final    AEROBIC BOTTLE ONLY GRAM POSITIVE RODS CRITICAL RESULT CALLED TO, READ BACK BY AND VERIFIED WITH: ANH PHARMD 09/28/17 1935 JDW Performed at [facility] [address] [place], [place] [address]    Culture GRAM POSITIVE RODS  Final   Report Status PENDING  Incomplete         Radiology Studies: No results found.      Scheduled Meds: . amLODipine  10 mg Oral Daily  . aspirin EC  81 mg Oral Daily  . diclofenac sodium  2 g Topical QID  . enoxaparin (LOVENOX) injection  40 mg Subcutaneous Q24H  . feeding supplement (ENSURE ENLIVE)  237 mL Oral BID BM  . metoprolol succinate  50 mg Oral Daily  . mirtazapine   30 mg Oral QHS  . polyethylene glycol  17 g Oral Daily  . QUEtiapine  100 mg Oral QHS   Continuous Infusions: . cefTRIAXone (ROCEPHIN)  IV 1 g (09/29/17 1006)     LOS: 3 days     Cordelia Poche, MD [facility] 09/29/2017, 11:26 AM Pager: [phone]  If 7PM-7AM, please contact night-coverage [email or web address] 09/29/2017, 11:26 AM

## 2017-09-30 ENCOUNTER — Encounter (HOSPITAL_COMMUNITY): Payer: Self-pay

## 2017-09-30 LAB — CBC
HCT: 27.3 % — ABNORMAL LOW (ref 36.0–46.0)
HEMOGLOBIN: 8.6 g/dL — AB (ref 12.0–15.0)
MCH: 27.9 pg (ref 26.0–34.0)
MCHC: 31.5 g/dL (ref 30.0–36.0)
MCV: 88.6 fL (ref 78.0–100.0)
Platelets: 221 10*3/uL (ref 150–400)
RBC: 3.08 MIL/uL — AB (ref 3.87–5.11)
RDW: 16.3 % — ABNORMAL HIGH (ref 11.5–15.5)
WBC: 9.1 10*3/uL (ref 4.0–10.5)

## 2017-09-30 LAB — CULTURE, BLOOD (ROUTINE X 2): Culture: NO GROWTH

## 2017-09-30 NOTE — Progress Notes (Signed)
PROGRESS NOTE    Monique Rush  CWC:[id number] DOB: [birth year] DOA: 09/25/2017 PCP: Benito Mccreedy, MD   Brief Narrative: Monique Rush is a 77 y.o. female with medical history significant of HTN, dementia. Patient presented with altered mental status, thought secondary to infection vs hypercalcemia. History of PRES with elevated blood pressure on admission. CT head unremarkable.   Assessment & Plan:   Active Problems:   Acute metabolic encephalopathy   Urinary tract infection without hematuria   Acute metabolic encephalopathy Thought to be secondary to infection. Unable to get in touch with son today, but per report, patient is more oriented at baseline and functional (driving, house work, Social research officer, government). Patient oriented to person and place only. Not oriented to situation. Urine culture unhelpful. CT head unremarkable for acute process. History of PRES. Discussed with PCP and patient, back in March, was oriented only to person and place. Patient did not tolerate MRI. Blood culture significant for gram positive rods. ?contaminent. Overall, improved. -Blood culture (final) -Continue Ceftriaxone for empiric UTI treatment  Hypercalcemia Calcium improved with IV fluids. May be exacerbated by hydrochlorothiazide, however, it is improved now. Ionized calcium elevated -Continue IV fluids -Repeat BMP in AM  Hypokalemia Resolved with repletion. Magnesium normal.  Anemia Last hemoglobin from 2 years ago. Anemia panel unremarkable. No active bleeding. Acute drop with IV fluids -Fecal occult blood test -Repeat CBC in AM  Tachycardia Sinus. Improved with IV fluids  LE edema No DVT on LE duplex  Essential hypertension Well controlled -Continue amlodipine, metoprolol -Hydralazine prn -Discontinued hydrochlorothiazide in setting of hypercalcemia  Depression/Insomnia -Continue Seroquel and Remeron  Dementia Not on medications as an outpatient. Previously on Aricept.  ?vascular.  Elevated AST Mild. Trended down slightly.  Multiple compression fractures Pain management. Outpatient follow-up   DVT prophylaxis: Lovenox Code Status:   Code Status: Full Code Family Communication: Daughter at bedside Disposition Plan: Discharge when mental status improved or if workup negative   Consultants:   Neurology (telephone)  Procedures:   None  Antimicrobials:  Ceftriaxone    Subjective: Feels well today. No back pain. Feels more herself.  Objective: Vitals:   09/29/17 0500 09/29/17 1312 09/29/17 2148 09/30/17 0545  BP: 129/75 136/75 (!) 158/61 (!) 142/68  Pulse: (!) 101 (!) 114 91 94  Resp: 18 18 18 18   Temp: (!) 97.4 F (36.3 C) 98.1 F (36.7 C) 97.7 F (36.5 C) (!) 97.5 F (36.4 C)  TempSrc: Oral Oral Oral Oral  SpO2: 94% 100% 95% 95%  Weight:      Height:        Intake/Output Summary (Last 24 hours) at 09/30/2017 1008 Last data filed at 09/30/2017 0600 Gross per 24 hour  Intake 1794.72 ml  Output 0 ml  Net 1794.72 ml   Filed Weights   09/25/17 0938 09/25/17 1648 09/26/17 0443  Weight: 74.8 kg 69.2 kg 69.5 kg    Examination:  General exam: Appears calm and comfortable Respiratory system: Respiratory effort normal. Gastrointestinal system: Abdomen is nondistended Central nervous system: Alert. No focal neurological deficits. Extremities: No edema. No calf tenderness Skin: No cyanosis. No rashes Psychiatry: Judgement and insight appear normal. Mood & affect appropriate.     Data Reviewed: I have personally reviewed following labs and imaging studies  CBC: Recent Labs  Lab 09/25/17 0949 09/25/17 1800 09/26/17 0547 09/27/17 1301 09/30/17 0908  WBC 8.2 10.1 8.2 8.5 9.1  NEUTROABS 5.7  --   --   --   --   HGB 9.7*  10.1* 9.0* 9.7* 8.6*  HCT 30.4* 31.9* 28.1* 30.0* 27.3*  MCV 87.4 87.2 87.3 86.0 88.6  PLT 197 192 212 203 578   Basic Metabolic Panel: Recent Labs  Lab 09/25/17 0949 09/25/17 1800 09/25/17 1805  09/25/17 1837 09/26/17 0547 09/27/17 1301 09/29/17 0856  NA 146*  --   --   --  141 139 143  K 2.7*  --   --   --  3.5 3.3* 3.9  CL 101  --   --   --  99 96* 100  CO2 31  --   --   --  29 31 29   GLUCOSE 131*  --   --   --  106* 158* 103*  BUN 20  --   --   --  13 18 23   CREATININE 1.01* 0.89  --   --  0.95 0.91 1.08*  CALCIUM 12.2*  --   --  11.1* 10.6* 10.1 10.5*  MG  --   --  2.1  --  1.9  --   --    GFR: Estimated Creatinine Clearance: 45.6 mL/min (A) (by C-G formula based on SCr of 1.08 mg/dL (H)). Liver Function Tests: Recent Labs  Lab 09/25/17 0949 09/26/17 0547  AST 84* 80*  ALT 21 20  ALKPHOS 120 106  BILITOT 0.9 0.7  PROT 6.9 6.0*  ALBUMIN 3.4* 3.1*   No results for input(s): LIPASE, AMYLASE in the last 168 hours. Recent Labs  Lab 09/26/17 1011  AMMONIA 10   Coagulation Profile: Recent Labs  Lab 09/25/17 0949  INR 0.99   Cardiac Enzymes: No results for input(s): CKTOTAL, CKMB, CKMBINDEX, TROPONINI in the last 168 hours. BNP (last 3 results) No results for input(s): PROBNP in the last 8760 hours. HbA1C: No results for input(s): HGBA1C in the last 72 hours. CBG: No results for input(s): GLUCAP in the last 168 hours. Lipid Profile: No results for input(s): CHOL, HDL, LDLCALC, TRIG, CHOLHDL, LDLDIRECT in the last 72 hours. Thyroid Function Tests: No results for input(s): TSH, T4TOTAL, FREET4, T3FREE, THYROIDAB in the last 72 hours. Anemia Panel: No results for input(s): VITAMINB12, FOLATE, FERRITIN, TIBC, IRON, RETICCTPCT in the last 72 hours. Sepsis Labs: Recent Labs  Lab 09/25/17 1048  LATICACIDVEN 1.75    Recent Results (from the past 240 hour(s))  Culture, blood (Routine x 2)     Status: None (Preliminary result)   Collection Time: 09/25/17  9:49 AM  Result Value Ref Range Status   Specimen Description   Final    BLOOD RIGHT FOREARM Performed at [facility] [address], [place], [place] [address]    Special  Requests   Final    BOTTLES DRAWN AEROBIC AND ANAEROBIC Blood Culture results may not be optimal due to an excessive volume of blood received in culture bottles Performed at [facility] [address], [place], [place] [address]    Culture   Final    NO GROWTH 4 DAYS Performed at [facility], [place] [address], [place], [place] [address]    Report Status PENDING  Incomplete  Urine culture     Status: Abnormal   Collection Time: 09/25/17  9:49 AM  Result Value Ref Range Status   Specimen Description   Final    URINE, CLEAN CATCH Performed at [facility], [place] [address], [place], [place] [address]    Special Requests   Final    NONE Performed at [facility], [place] [address] [place], [place] [address]    Culture MULTIPLE SPECIES PRESENT, SUGGEST RECOLLECTION (A)  Final   Report Status 09/26/2017 FINAL  Final  Culture, blood (Routine x 2)     Status: Abnormal   Collection Time: 09/25/17  9:54 AM  Result Value Ref Range Status   Specimen Description   Final    BLOOD LEFT FOREARM Performed at [facility] [address] [place], [place] [address]    Special Requests   Final    BOTTLES DRAWN AEROBIC AND ANAEROBIC Blood Culture adequate volume Performed at [facility] [address] [place], [place] [address]    Culture  Setup Time   Final    AEROBIC BOTTLE ONLY GRAM POSITIVE RODS CRITICAL RESULT CALLED TO, READ BACK BY AND VERIFIED WITH: ANH PHARMD 09/28/17 1935 JDW    Culture (A)  Final    BACILLUS SPECIES Standardized susceptibility testing for this organism is not available. Performed at [facility], [place] [address] [place],  [address]    Report Status 09/29/2017 FINAL  Final         Radiology Studies: Dg Thoracic Spine 2 View  Result Date: 09/29/2017 CLINICAL DATA:  Pt states chronic back pain mostly in lower tspine. Advised worsened today and was  unable to lay flat on bed for images. EXAM: THORACIC SPINE 2 VIEWS COMPARISON:  Chest radiograph, 09/25/2017 FINDINGS: There are multiple fractures. Mild compression fracture of T8. Mild to moderate compression fracture of T10. Moderate to severe compression fracture of T12. Moderate to severe compression fracture of L1. These fractures were present on the prior lateral chest radiograph. These were not evident on a lateral chest radiograph from 02/10/2014. Bones are diffusely demineralized. There is a mild-to-moderate dextroscoliosis, apex in the midthoracic spine. No spondylolisthesis. No bone lesion. Soft tissues are unremarkable. IMPRESSION: 1. Multiple thoracolumbar vertebral compression fractures of unclear chronicity. 2. Mild-to-moderate dextroscoliosis of the midthoracic spine. 3. Diffuse bony demineralization. Electronically Signed   By: Lajean Manes M.D.   On: 09/29/2017 16:58   Dg Lumbar Spine 2-3 Views  Result Date: 09/29/2017 CLINICAL DATA:  Pt states chronic back pain mostly in lower tspine. Advised worsened today and was unable to lay flat on bed for images. Best obtained received EXAM: LUMBAR SPINE - 2-3 VIEW COMPARISON:  CT, 02/20/2014 FINDINGS: Moderate to severe compression fractures of T12 and L1. Mild compression fracture of L2. Normal height of the L3, L4 and L5 vertebrae. No spondylolisthesis. Moderate loss of disc height throughout the lumbar spine. Bones are diffusely demineralized. Moderate dextroscoliosis, apex at L2-L3. 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 IMPRESSION: 1. Multiple compression fractures as described which are new when compared to the prior CT scan, but of unclear chronicity. 2. Diffuse bony demineralization, disk degenerative changes and dextroscoliosis similar to the prior CT scan. Electronically Signed   By: Lajean Manes M.D.   On: 09/29/2017 17:00        Scheduled Meds: . amLODipine  10 mg Oral Daily  . aspirin EC  81 mg Oral Daily  .  diclofenac sodium  2 g Topical QID  . enoxaparin (LOVENOX) injection  40 mg Subcutaneous Q24H  . feeding supplement (ENSURE ENLIVE)  237 mL Oral BID BM  . metoprolol succinate  50 mg Oral Daily  . mirtazapine  30 mg Oral QHS  . polyethylene glycol  17 g Oral Daily  . QUEtiapine  100 mg Oral QHS  Continuous Infusions: . sodium chloride 100 mL/hr at 09/29/17 2258  . cefTRIAXone (ROCEPHIN)  IV 1 g (09/30/17 0914)     LOS: 4 days     Cordelia Poche, MD [facility] 09/30/2017, 10:08 AM Pager: [phone]  If 7PM-7AM, please contact night-coverage [email or web address] 09/30/2017, 10:08 AM

## 2017-10-01 DIAGNOSIS — S22000A Wedge compression fracture of unspecified thoracic vertebra, initial encounter for closed fracture: Secondary | ICD-10-CM

## 2017-10-01 DIAGNOSIS — R410 Disorientation, unspecified: Secondary | ICD-10-CM

## 2017-10-01 LAB — CBC
HCT: 27.4 % — ABNORMAL LOW (ref 36.0–46.0)
HEMOGLOBIN: 8.5 g/dL — AB (ref 12.0–15.0)
MCH: 27.2 pg (ref 26.0–34.0)
MCHC: 31 g/dL (ref 30.0–36.0)
MCV: 87.8 fL (ref 78.0–100.0)
Platelets: 228 10*3/uL (ref 150–400)
RBC: 3.12 MIL/uL — ABNORMAL LOW (ref 3.87–5.11)
RDW: 16.4 % — AB (ref 11.5–15.5)
WBC: 8.2 10*3/uL (ref 4.0–10.5)

## 2017-10-01 LAB — BASIC METABOLIC PANEL
ANION GAP: 9 (ref 5–15)
BUN: 16 mg/dL (ref 8–23)
CHLORIDE: 107 mmol/L (ref 98–111)
CO2: 26 mmol/L (ref 22–32)
Calcium: 9.5 mg/dL (ref 8.9–10.3)
Creatinine, Ser: 0.89 mg/dL (ref 0.44–1.00)
GFR calc Af Amer: >60 mL/min (ref >60)
GFR calc non Af Amer: >60 mL/min (ref >60)
GLUCOSE: 122 mg/dL — AB (ref 70–99)
POTASSIUM: 3 mmol/L — AB (ref 3.5–5.1)
Sodium: 142 mmol/L (ref 135–145)

## 2017-10-01 MED ORDER — QUETIAPINE FUMARATE 100 MG PO TABS: 100.0000 mg | ORAL_TABLET | Freq: Every day | ORAL | Status: AC

## 2017-10-01 MED ORDER — AMLODIPINE BESYLATE 10 MG PO TABS: 10.0000 mg | ORAL_TABLET | Freq: Every day | ORAL | Status: AC

## 2017-10-01 MED ORDER — MIRTAZAPINE 30 MG PO TABS: 30.0000 mg | ORAL_TABLET | Freq: Every day | ORAL | Status: AC

## 2017-10-01 MED ORDER — DICLOFENAC SODIUM 1 % TD GEL: 2.0000 g | Freq: Four times a day (QID) | TRANSDERMAL | Status: AC

## 2017-10-01 MED ORDER — ENSURE ENLIVE PO LIQD: 237.0000 mL | Freq: Two times a day (BID) | ORAL | Status: AC

## 2017-10-01 MED ORDER — POTASSIUM CHLORIDE CRYS ER 20 MEQ PO TBCR
40.0000 meq | EXTENDED_RELEASE_TABLET | Freq: Once | ORAL | Status: AC
  Administered 2017-10-01: 40 meq via ORAL
  Filled 2017-10-01: qty 2

## 2017-10-01 MED ORDER — POLYETHYLENE GLYCOL 3350 17 G PO PACK: 17.0000 g | PACK | Freq: Every day | ORAL | Status: DC | PRN

## 2017-10-01 NOTE — Plan of Care (Signed)
  Problem: Health Behavior/Discharge Planning: Goal: Ability to manage health-related needs will improve Outcome: Progressing   Problem: Clinical Measurements: Goal: Ability to maintain clinical measurements within normal limits will improve Outcome: Progressing Goal: Will remain free from infection Outcome: Progressing Goal: Diagnostic test results will improve Outcome: Progressing Goal: Respiratory complications will improve Outcome: Progressing Goal: Cardiovascular complication will be avoided Outcome: Progressing   Problem: Activity: Goal: Risk for activity intolerance will decrease Outcome: Progressing   Problem: Coping: Goal: Level of anxiety will decrease Outcome: Progressing

## 2017-10-01 NOTE — Progress Notes (Signed)
Called and informed Merry Proud son of transfer to [facility]. No questions or concerns noted.

## 2017-10-01 NOTE — Progress Notes (Signed)
Discharge instructions (including medications) discussed with and copy provided to patient/caregiver 

## 2017-10-01 NOTE — Discharge Summary (Signed)
Physician Discharge Summary  Monique Rush SWH:[id number] DOB: February 28, 1940 DOA: 09/25/2017  PCP: Benito Mccreedy, MD  Admit date: 09/25/2017 Discharge date: 10/01/2017  Admitted From: Home Disposition: SNF  Recommendations for Outpatient Follow-up:  1. Follow up with PCP in 1 week 2. Please obtain CMP/CBC in one week 3. Please follow up on the following pending results: None  Home Health: SNF Equipment/Devices: Rolling walker  Discharge Condition: Stable CODE STATUS: Full code Diet recommendation: Heart healthy   Brief/Interim Summary:  Admission HPI written by A Melven Sartorius., MD   Chief Complaint: AMS  HPI: Monique Rush is a 77 y.o. female with medical history significant of HTN, dementia presenting with altered mental status.  Patient is unable to provide any good history due to her mental status.  Her son notes that he was called by EMS and they told him that she had called looking for her husband.  Her husband is deceased.  The patient is able to say that she is here partially because her memory was not as good as it should be.  She denies any fevers, she denies any chills or chest pain.  She denies any shortness of breath.  She notes some abdominal pain that comes and goes.  She points to the bellybutton when asked to locate this.  She denies any dysuria or symptoms of a urinary tract infection.  She notes some back pain has been going on for a couple weeks as well.  Her son notes that she has been a little bit more confused over the past couple of weeks.  He had her sent to the hospital today because he felt like it was getting worse.  She lives at home alone.  ED Course: Labs, EKG, head CT.  Admit for AMS and possible UTI.   Hospital course:  Acute metabolic encephalopathy Thought to be secondary to infection. Patient treated for UTI and hypercalcemia. Blood culture significant for gram positive rods which is likely a contaminent. Overall, improved.  Completed antibiotic course.  Hypercalcemia Calcium improved with IV fluids. May be exacerbated by hydrochlorothiazide, however, it is improved now. Ionized calcium elevated when checked. Repeat calcium normal prior to discharge.  Hypokalemia Resolved with repletion. Magnesium normal.  Anemia Last hemoglobin from 2 years ago. Anemia panel unremarkable. No active bleeding. Stable. Acute drop with IV fluids. Outpatient follow-up.  Tachycardia Sinus. Improved with IV fluids  LE edema No DVT on LE duplex.  Essential hypertension Well controlled. Continue amlodipine, metoprolol. Discontinued hydrochlorothiazide secondary to hypercalcemia.  Depression/Insomnia Continued Seroquel and Remeron  Dementia Not on medications as an outpatient. Previously on Aricept. ?vascular.  Elevated AST Mild. Trended down. Outpatient follow-up  Multiple compression fractures Pain management with Tylenol. Can use Diclofenac as needed. Outpatient follow-up  Discharge Diagnoses:  Principal Problem:   Delirium Active Problems:   Acute metabolic encephalopathy   Urinary tract infection without hematuria   Compression fracture of body of thoracic vertebra [facility])    Discharge Instructions   Allergies as of 10/01/2017      Reactions   Morphine And Related Nausea And Vomiting   Oxycontin [oxycodone Hcl] Nausea And Vomiting   Percocet [oxycodone-acetaminophen] Nausea And Vomiting      Medication List    STOP taking these medications   hydrochlorothiazide 12.5 MG tablet Commonly known as:  HYDRODIURIL   meloxicam 7.5 MG tablet Commonly known as:  MOBIC     TAKE these medications   amLODipine 10 MG tablet Commonly known as:  NORVASC Take 1 tablet (10 mg total) by mouth daily.   diclofenac sodium 1 % Gel Commonly known as:  VOLTAREN Apply 2 g topically 4 (four) times daily.   feeding supplement (ENSURE ENLIVE) Liqd Take 237 mLs by mouth 2 (two) times daily between meals.     metoprolol succinate 50 MG 24 hr tablet Commonly known as:  TOPROL-XL Take 50 mg by mouth daily. Take with or immediately following a meal.   mirtazapine 30 MG tablet Commonly known as:  REMERON Take 1 tablet (30 mg total) by mouth at bedtime. What changed:  how much to take   polyethylene glycol packet Commonly known as:  MIRALAX / GLYCOLAX Take 17 g by mouth daily as needed.   pravastatin 20 MG tablet Commonly known as:  PRAVACHOL Take 20 mg by mouth daily.   QUEtiapine 100 MG tablet Commonly known as:  SEROQUEL Take 1 tablet (100 mg total) by mouth at bedtime. For sleep What changed:    when to take this  reasons to take this            Durable Medical Equipment  (From admission, onward)         Start     Ordered   10/01/17 1152  For home use only DME Walker rolling  Once    Question:  Patient needs a walker to treat with the following condition  Answer:  Dementia   10/01/17 1153         Contact information for after-discharge care    Destination    [facility]erred [facility] .   Service:  Skilled Nursing Contact information: [address] [place] [phone]             Allergies  Allergen Reactions  . Morphine And Related Nausea And Vomiting  . Oxycontin [Oxycodone Hcl] Nausea And Vomiting  . Percocet [Oxycodone-Acetaminophen] Nausea And Vomiting    Consultations:  Neurology (telephone)   Procedures/Studies: Dg Chest 2 View  Result Date: 09/25/2017 CLINICAL DATA:  Confusion EXAM: CHEST - 2 VIEW COMPARISON:  February 10, 2014 FINDINGS: There is no edema or consolidation. The heart size and pulmonary vascularity are normal. No adenopathy. No evident bone lesions. There is aortic atherosclerosis. IMPRESSION: No edema or consolidation.  There is aortic atherosclerosis. Aortic Atherosclerosis (ICD10-I70.0). Electronically Signed   By: Lowella Grip III M.D.   On: 09/25/2017 12:19   Dg Thoracic Spine 2  View  Result Date: 09/29/2017 CLINICAL DATA:  Pt states chronic back pain mostly in lower tspine. Advised worsened today and was unable to lay flat on bed for images. EXAM: THORACIC SPINE 2 VIEWS COMPARISON:  Chest radiograph, 09/25/2017 FINDINGS: There are multiple fractures. Mild compression fracture of T8. Mild to moderate compression fracture of T10. Moderate to severe compression fracture of T12. Moderate to severe compression fracture of L1. These fractures were present on the prior lateral chest radiograph. These were not evident on a lateral chest radiograph from 02/10/2014. Bones are diffusely demineralized. There is a mild-to-moderate dextroscoliosis, apex in the midthoracic spine. No spondylolisthesis. No bone lesion. Soft tissues are unremarkable. IMPRESSION: 1. Multiple thoracolumbar vertebral compression fractures of unclear chronicity. 2. Mild-to-moderate dextroscoliosis of the midthoracic spine. 3. Diffuse bony demineralization. Electronically Signed   By: Lajean Manes M.D.   On: 09/29/2017 16:58   Dg Lumbar Spine 2-3 Views  Result Date: 09/29/2017 CLINICAL DATA:  Pt states chronic back pain mostly in lower tspine. Advised worsened today and was unable to  lay flat on bed for images. Best obtained received EXAM: LUMBAR SPINE - 2-3 VIEW COMPARISON:  CT, 02/20/2014 FINDINGS: Moderate to severe compression fractures of T12 and L1. Mild compression fracture of L2. Normal height of the L3, L4 and L5 vertebrae. No spondylolisthesis. Moderate loss of disc height throughout the lumbar spine. Bones are diffusely demineralized. Moderate dextroscoliosis, apex at L2-L3. 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 IMPRESSION: 1. Multiple compression fractures as described which are new when compared to the prior CT scan, but of unclear chronicity. 2. Diffuse bony demineralization, disk degenerative changes and dextroscoliosis similar to the prior CT scan. Electronically Signed   By: Lajean Manes M.D.   On: 09/29/2017 17:00   Ct Head Wo Contrast  Result Date: 09/25/2017 CLINICAL DATA:  Confusion EXAM: CT HEAD WITHOUT CONTRAST TECHNIQUE: Contiguous axial images were obtained from the base of the skull through the vertex without intravenous contrast. COMPARISON:  02/12/2014 FINDINGS: Brain: Atrophic changes and chronic white matter ischemic change is seen. No findings to suggest acute hemorrhage, acute infarction or space-occupying mass lesion are noted. Vascular: No hyperdense vessel or unexpected calcification. Skull: Normal. Negative for fracture or focal lesion. Sinuses/Orbits: No acute finding. Other: None. IMPRESSION: Chronic atrophic and ischemic changes without acute abnormality. Electronically Signed   By: Inez Catalina M.D.   On: 09/25/2017 14:17      Subjective: No issues today.   Discharge Exam: Vitals:   09/30/17 2134 10/01/17 0557  BP: (!) 156/74 (!) 141/79  Pulse: 96 98  Resp: 18 18  Temp: 98.2 F (36.8 C) 98 F (36.7 C)  SpO2: 94% 96%   Vitals:   09/30/17 1324 09/30/17 1518 09/30/17 2134 10/01/17 0557  BP: 135/69  (!) 156/74 (!) 141/79  Pulse: (!) 101  96 98  Resp: 17  18 18   Temp: 97.6 F (36.4 C)  98.2 F (36.8 C) 98 F (36.7 C)  TempSrc: Oral  Oral Oral  SpO2: 93% 90% 94% 96%  Weight:      Height:        General: Pt is alert, awake, not in acute distress Cardiovascular: RRR, S1/S2 +, no rubs, no gallops Respiratory: CTA bilaterally, no wheezing, no rhonchi Abdominal: Soft, NT, ND, bowel sounds + Extremities: no edema, no cyanosis    The results of significant diagnostics from this hospitalization (including imaging, microbiology, ancillary and laboratory) are listed below for reference.     Microbiology: Recent Results (from the past 240 hour(s))  Culture, blood (Routine x 2)     Status: None   Collection Time: 09/25/17  9:49 AM  Result Value Ref Range Status   Specimen Description   Final    BLOOD RIGHT FOREARM Performed at [facility] [address] [place], [place] [address]    Special Requests   Final    BOTTLES DRAWN AEROBIC AND ANAEROBIC Blood Culture results may not be optimal due to an excessive volume of blood received in culture bottles Performed at [facility] [address] [place], [place] [address]    Culture   Final    NO GROWTH 5 DAYS Performed at [facility], [place] [address] [place], [place] [address]    Report Status 09/30/2017 FINAL  Final  Urine culture     Status: Abnormal   Collection Time: 09/25/17  9:49 AM  Result Value Ref Range Status   Specimen Description   Final    URINE, CLEAN CATCH Performed  at [facility], [place] [address], [place], [place] [address]    Special Requests   Final    NONE Performed at [facility], [place] [address], [place], [place] [address]    Culture MULTIPLE SPECIES PRESENT, SUGGEST RECOLLECTION (A)  Final   Report Status 09/26/2017 FINAL  Final  Culture, blood (Routine x 2)     Status: Abnormal   Collection Time: 09/25/17  9:54 AM  Result Value Ref Range Status   Specimen Description   Final    BLOOD LEFT FOREARM Performed at [facility] [address] [place], [place] [address]    Special Requests   Final    BOTTLES DRAWN AEROBIC AND ANAEROBIC Blood Culture adequate volume Performed at [facility] [address] [place], [place] [address]    Culture  Setup Time   Final    AEROBIC BOTTLE ONLY GRAM POSITIVE RODS CRITICAL RESULT CALLED TO, READ BACK BY AND VERIFIED WITH: ANH PHARMD 09/28/17 1935 JDW    Culture (A)  Final    BACILLUS SPECIES Standardized susceptibility testing for this organism is not available. Performed at [facility], [place] [address], [place], [place] [address]    Report Status 09/29/2017 FINAL  Final     Labs: BNP (last 3 results) No results for input(s): BNP in the last 8760 hours. Basic  Metabolic Panel: Recent Labs  Lab 09/25/17 0949 09/25/17 1800 09/25/17 1805 09/25/17 1837 09/26/17 0547 09/27/17 1301 09/29/17 0856 10/01/17 0512  NA 146*  --   --   --  141 139 143 142  K 2.7*  --   --   --  3.5 3.3* 3.9 3.0*  CL 101  --   --   --  99 96* 100 107  CO2 31  --   --   --  29 31 29 26   GLUCOSE 131*  --   --   --  106* 158* 103* 122*  BUN 20  --   --   --  13 18 23 16   CREATININE 1.01* 0.89  --   --  0.95 0.91 1.08* 0.89  CALCIUM 12.2*  --   --  11.1* 10.6* 10.1 10.5* 9.5  MG  --   --  2.1  --  1.9  --   --   --    Liver Function Tests: Recent Labs  Lab 09/25/17 0949 09/26/17 0547  AST 84* 80*  ALT 21 20  ALKPHOS 120 106  BILITOT 0.9 0.7  PROT 6.9 6.0*  ALBUMIN 3.4* 3.1*   No results for input(s): LIPASE, AMYLASE in the last 168 hours. Recent Labs  Lab 09/26/17 1011  AMMONIA 10   CBC: Recent Labs  Lab 09/25/17 0949 09/25/17 1800 09/26/17 0547 09/27/17 1301 09/30/17 0908 10/01/17 0512  WBC 8.2 10.1 8.2 8.5 9.1 8.2  NEUTROABS 5.7  --   --   --   --   --   HGB 9.7* 10.1* 9.0* 9.7* 8.6* 8.5*  HCT 30.4* 31.9* 28.1* 30.0* 27.3* 27.4*  MCV 87.4 87.2 87.3 86.0 88.6 87.8  PLT 197 192 212 203 221 228   Cardiac Enzymes: No results for input(s): CKTOTAL, CKMB, CKMBINDEX, TROPONINI in the last 168 hours. BNP: Invalid input(s): POCBNP CBG: No results for input(s): GLUCAP in the last 168 hours. D-Dimer No results for input(s): DDIMER in the last 72 hours. Hgb A1c No results for input(s): HGBA1C in the last 72 hours. Lipid  Profile No results for input(s): CHOL, HDL, LDLCALC, TRIG, CHOLHDL, LDLDIRECT in the last 72 hours. Thyroid function studies No results for input(s): TSH, T4TOTAL, T3FREE, THYROIDAB in the last 72 hours.  Invalid input(s): FREET3 Anemia work up No results for input(s): VITAMINB12, FOLATE, FERRITIN, TIBC, IRON, RETICCTPCT in the last 72 hours. Urinalysis    Component Value Date/Time   COLORURINE YELLOW 09/25/2017 [place] 09/25/2017 0949   LABSPEC 1.013 09/25/2017 0949   PHURINE 6.0 09/25/2017 0949   GLUCOSEU NEGATIVE 09/25/2017 0949   HGBUR SMALL (A) 09/25/2017 0949   BILIRUBINUR NEGATIVE 09/25/2017 0949   KETONESUR NEGATIVE 09/25/2017 0949   PROTEINUR NEGATIVE 09/25/2017 0949   UROBILINOGEN 1.0 02/20/2014 1520   NITRITE NEGATIVE 09/25/2017 0949   LEUKOCYTESUR LARGE (A) 09/25/2017 0949   Sepsis Labs Invalid input(s): PROCALCITONIN,  WBC,  LACTICIDVEN Microbiology Recent Results (from the past 240 hour(s))  Culture, blood (Routine x 2)     Status: None   Collection Time: 09/25/17  9:49 AM  Result Value Ref Range Status   Specimen Description   Final    BLOOD RIGHT FOREARM Performed at [facility], [place] [address], [place], [place] [address]    Special Requests   Final    BOTTLES DRAWN AEROBIC AND ANAEROBIC Blood Culture results may not be optimal due to an excessive volume of blood received in culture bottles Performed at [facility] [address] [place], [place] [address]    Culture   Final    NO GROWTH 5 DAYS Performed at [facility], [place] [address] [place], [place] [address]    Report Status 09/30/2017 FINAL  Final  Urine culture     Status: Abnormal   Collection Time: 09/25/17  9:49 AM  Result Value Ref Range Status   Specimen Description   Final    URINE, CLEAN CATCH Performed at [facility], [place] [address], [place], [place] [address]    Special Requests   Final    NONE Performed at [facility], [place] [address], [place], [place] [address]    Culture MULTIPLE SPECIES PRESENT, SUGGEST RECOLLECTION (A)  Final   Report Status 09/26/2017 FINAL  Final  Culture, blood (Routine x 2)     Status: Abnormal   Collection Time: 09/25/17  9:54 AM  Result Value Ref Range Status   Specimen Description   Final    BLOOD LEFT FOREARM Performed at [facility]  [address] [place], [place] [address]    Special Requests   Final    BOTTLES DRAWN AEROBIC AND ANAEROBIC Blood Culture adequate volume Performed at [facility] [address] [place], [place] [address]    Culture  Setup Time   Final    AEROBIC BOTTLE ONLY GRAM POSITIVE RODS CRITICAL RESULT CALLED TO, READ BACK BY AND VERIFIED WITH: ANH PHARMD 09/28/17 1935 JDW    Culture (A)  Final    BACILLUS SPECIES Standardized susceptibility testing for this organism is not available. Performed at [facility], [place] [address] [place],  [address]    Report Status 09/29/2017 FINAL  Final    SIGNED:   Cordelia Poche, MD [facility] 10/01/2017, 12:16 PM

## 2017-10-01 NOTE — Progress Notes (Addendum)
Patient ready for discharge today. Patient will need updated physical therapy notes for new insurance approval for SNF placement. CSW informed patient RN.   CSW updated SNF liaison.   CSW will continue to assist with D/C needs.   Updated PT clinicals received. Patient able to D/C today. CSW notified patient son Merry Proud. Physician notified. PTAR to transport.  Kathrin Greathouse, Marlinda Mike, MSW Clinical Social Worker  [phone] 10/01/2017  10:13 AM

## 2017-10-01 NOTE — Progress Notes (Signed)
Physical Therapy Treatment Patient Details Name: Monique Rush MRN: [id number] DOB: Jun 04, 1940 Today's Date: 10/01/2017    History of Present Illness 77 yo female admitted with acute met encephalopathy, hypokalemia, UTI. Hx of L THA 2015, dementia, falls    PT Comments    Pt continues to participate well. Ambulation distance limited by back pain on today. Pt remains weak and at risk for falls. Pt c/o moderate pain. Bladder incontinence noted-Mod assist for hygiene/changing gown. Continue to recommend SNF.    Follow Up Recommendations  SNF      Equipment Recommendations  Rolling walker with 5" wheels    Recommendations for Other Services OT consult     Precautions / Restrictions Precautions Precautions: Fall Restrictions Weight Bearing Restrictions: No    Mobility  Bed Mobility Overal bed mobility: Needs Assistance Bed Mobility: Supine to Sit     Supine to sit: Min guard     General bed mobility comments: close guard for safety, managing lines. Cues for safety.   Transfers Overall transfer level: Needs assistance Equipment used: Rolling walker (2 wheeled);None Transfers: Sit to/from [employer] to Stand: Min assist Stand pivot transfers: Min assist       General transfer comment: VCs safety, hand placement.  Assist to stabilize, control descent. Again, pt attempts to sit before safely positioned. Squat pivot, bed to recliner  Ambulation/Gait Ambulation/Gait assistance: Min assist Gait Distance (Feet): 20 Feet Assistive device: Rolling walker (2 wheeled) Gait Pattern/deviations: Step-through pattern;Trunk flexed     General Gait Details: Assist to stabilize pt and to maneuver safely with RW throughout distance. Multimodal cueing required. Pt is unsteady and at high risk for falls. Fatigues easily. Distance limited by back pain on today.    Stairs             Wheelchair Mobility    Modified Rankin (Stroke Patients Only)        Balance Overall balance assessment: Needs assistance;History of Falls         Standing balance support: Bilateral upper extremity supported Standing balance-Leahy Scale: Poor                              Cognition Arousal/Alertness: Awake/alert Behavior During Therapy: WFL for tasks assessed/performed Overall Cognitive Status: History of cognitive impairments - at baseline                                        Exercises      General Comments        Pertinent Vitals/Pain Pain Assessment: Faces Faces Pain Scale: Hurts even more Pain Location: back Pain Descriptors / Indicators: Aching;Discomfort;Sore Pain Intervention(s): Limited activity within patient's tolerance;Repositioned    Home Living                      Prior Function            PT Goals (current goals can now be found in the care plan section) Progress towards PT goals: Progressing toward goals    Frequency    Min 3X/week      PT Plan Current plan remains appropriate    Co-evaluation              AM-PAC PT "6 Clicks" Daily Activity  Outcome Measure  Difficulty turning over in bed (including adjusting bedclothes, sheets  and blankets)?: A Little Difficulty moving from lying on back to sitting on the side of the bed? : A Little Difficulty sitting down on and standing up from a chair with arms (e.g., wheelchair, bedside commode, etc,.)?: Unable Help needed moving to and from a bed to chair (including a wheelchair)?: A Little Help needed walking in hospital room?: A Little Help needed climbing 3-5 steps with a railing? : A Lot 6 Click Score: 15    End of Session Equipment Utilized During Treatment: Gait belt Activity Tolerance: Patient limited by fatigue;Patient limited by pain Patient left: in chair;with call bell/phone within reach;with chair alarm set   PT Visit Diagnosis: Muscle weakness (generalized) (M62.81);Difficulty in walking, not  elsewhere classified (R26.2);History of falling (Z91.81)     Time: 3917-9217 PT Time Calculation (min) (ACUTE ONLY): 11 min  Charges:  $Gait Training: 8-22 mins                        Weston Anna, MPT Pager: [phone]

## 2017-10-01 NOTE — Discharge Instructions (Signed)
Monique Rush,  You were admitted for confusion. This is likely secondary to an infection versus high calcium. Both of these have been treated. Please stop your hydrochlorothiazide.

## 2017-10-20 ENCOUNTER — Emergency Department (HOSPITAL_COMMUNITY): Payer: Medicare Other

## 2017-10-20 ENCOUNTER — Encounter (HOSPITAL_COMMUNITY): Payer: Self-pay

## 2017-10-20 ENCOUNTER — Observation Stay (HOSPITAL_COMMUNITY): Payer: Medicare Other

## 2017-10-20 ENCOUNTER — Other Ambulatory Visit: Payer: Self-pay

## 2017-10-20 ENCOUNTER — Inpatient Hospital Stay (HOSPITAL_COMMUNITY)
Admission: EM | Admit: 2017-10-20 | Discharge: 2017-10-24 | DRG: 598 | Disposition: A | Discharge status: Skilled Nursing Facility | Payer: Medicare Other | Source: Skilled Nursing Facility | Attending: Internal Medicine | Admitting: Internal Medicine

## 2017-10-20 DIAGNOSIS — N390 Urinary tract infection, site not specified: Secondary | ICD-10-CM | POA: Diagnosis present

## 2017-10-20 DIAGNOSIS — R109 Unspecified abdominal pain: Secondary | ICD-10-CM | POA: Diagnosis not present

## 2017-10-20 DIAGNOSIS — Z820 Family history of epilepsy and other diseases of the nervous system: Secondary | ICD-10-CM

## 2017-10-20 DIAGNOSIS — R918 Other nonspecific abnormal finding of lung field: Secondary | ICD-10-CM | POA: Diagnosis not present

## 2017-10-20 DIAGNOSIS — L8990 Pressure ulcer of unspecified site, unspecified stage: Secondary | ICD-10-CM

## 2017-10-20 DIAGNOSIS — C7802 Secondary malignant neoplasm of left lung: Secondary | ICD-10-CM | POA: Diagnosis present

## 2017-10-20 DIAGNOSIS — E876 Hypokalemia: Secondary | ICD-10-CM | POA: Diagnosis present

## 2017-10-20 DIAGNOSIS — Z96642 Presence of left artificial hip joint: Secondary | ICD-10-CM | POA: Diagnosis present

## 2017-10-20 DIAGNOSIS — I10 Essential (primary) hypertension: Secondary | ICD-10-CM | POA: Diagnosis present

## 2017-10-20 DIAGNOSIS — D649 Anemia, unspecified: Secondary | ICD-10-CM | POA: Diagnosis not present

## 2017-10-20 DIAGNOSIS — E785 Hyperlipidemia, unspecified: Secondary | ICD-10-CM | POA: Diagnosis present

## 2017-10-20 DIAGNOSIS — M8458XA Pathological fracture in neoplastic disease, other specified site, initial encounter for fracture: Secondary | ICD-10-CM | POA: Diagnosis present

## 2017-10-20 DIAGNOSIS — C7951 Secondary malignant neoplasm of bone: Secondary | ICD-10-CM | POA: Diagnosis present

## 2017-10-20 DIAGNOSIS — F0390 Unspecified dementia without behavioral disturbance: Secondary | ICD-10-CM | POA: Diagnosis present

## 2017-10-20 DIAGNOSIS — R627 Adult failure to thrive: Secondary | ICD-10-CM | POA: Diagnosis present

## 2017-10-20 DIAGNOSIS — N63 Unspecified lump in unspecified breast: Secondary | ICD-10-CM | POA: Diagnosis present

## 2017-10-20 DIAGNOSIS — C7801 Secondary malignant neoplasm of right lung: Secondary | ICD-10-CM | POA: Diagnosis present

## 2017-10-20 DIAGNOSIS — C50911 Malignant neoplasm of unspecified site of right female breast: Secondary | ICD-10-CM | POA: Diagnosis not present

## 2017-10-20 DIAGNOSIS — Z66 Do not resuscitate: Secondary | ICD-10-CM

## 2017-10-20 DIAGNOSIS — L89151 Pressure ulcer of sacral region, stage 1: Secondary | ICD-10-CM | POA: Diagnosis present

## 2017-10-20 DIAGNOSIS — R531 Weakness: Secondary | ICD-10-CM

## 2017-10-20 DIAGNOSIS — Z8249 Family history of ischemic heart disease and other diseases of the circulatory system: Secondary | ICD-10-CM

## 2017-10-20 DIAGNOSIS — Z515 Encounter for palliative care: Secondary | ICD-10-CM

## 2017-10-20 DIAGNOSIS — R5383 Other fatigue: Secondary | ICD-10-CM

## 2017-10-20 DIAGNOSIS — Z681 Body mass index (BMI) 19 or less, adult: Secondary | ICD-10-CM

## 2017-10-20 DIAGNOSIS — C787 Secondary malignant neoplasm of liver and intrahepatic bile duct: Secondary | ICD-10-CM | POA: Diagnosis present

## 2017-10-20 LAB — PREPARE RBC (CROSSMATCH)

## 2017-10-20 LAB — URINALYSIS, ROUTINE W REFLEX MICROSCOPIC
BILIRUBIN URINE: NEGATIVE
Glucose, UA: NEGATIVE mg/dL
KETONES UR: NEGATIVE mg/dL
NITRITE: NEGATIVE
PH: 7 (ref 5.0–8.0)
Protein, ur: NEGATIVE mg/dL
Specific Gravity, Urine: 1.009 (ref 1.005–1.030)

## 2017-10-20 LAB — MAGNESIUM: MAGNESIUM: 2 mg/dL (ref 1.7–2.4)

## 2017-10-20 LAB — BASIC METABOLIC PANEL
Anion gap: 14 (ref 5–15)
BUN: 19 mg/dL (ref 8–23)
CHLORIDE: 99 mmol/L (ref 98–111)
CO2: 24 mmol/L (ref 22–32)
CREATININE: 0.83 mg/dL (ref 0.44–1.00)
Calcium: 9.1 mg/dL (ref 8.9–10.3)
Glucose, Bld: 100 mg/dL — ABNORMAL HIGH (ref 70–99)
Potassium: 3.1 mmol/L — ABNORMAL LOW (ref 3.5–5.1)
SODIUM: 137 mmol/L (ref 135–145)

## 2017-10-20 LAB — ABO/RH: ABO/RH(D): O POS

## 2017-10-20 LAB — CBC
HCT: 24.2 % — ABNORMAL LOW (ref 36.0–46.0)
Hemoglobin: 7.2 g/dL — ABNORMAL LOW (ref 12.0–15.0)
MCH: 27.2 pg (ref 26.0–34.0)
MCHC: 29.8 g/dL — ABNORMAL LOW (ref 30.0–36.0)
MCV: 91.3 fL (ref 78.0–100.0)
PLATELETS: 136 10*3/uL — AB (ref 150–400)
RBC: 2.65 MIL/uL — AB (ref 3.87–5.11)
RDW: 17 % — ABNORMAL HIGH (ref 11.5–15.5)
WBC: 10.5 10*3/uL (ref 4.0–10.5)

## 2017-10-20 LAB — IRON AND TIBC
Iron: 61 ug/dL (ref 28–170)
Saturation Ratios: 23 % (ref 10.4–31.8)
TIBC: 263 ug/dL (ref 250–450)
UIBC: 202 ug/dL

## 2017-10-20 LAB — POC OCCULT BLOOD, ED: Fecal Occult Bld: NEGATIVE

## 2017-10-20 LAB — CBG MONITORING, ED: Glucose-Capillary: 94 mg/dL (ref 70–99)

## 2017-10-20 LAB — RETICULOCYTES
RBC.: 2.74 MIL/uL — AB (ref 3.87–5.11)
RETIC CT PCT: 2.6 % (ref 0.4–3.1)
Retic Count, Absolute: 71.2 10*3/uL (ref 19.0–186.0)

## 2017-10-20 LAB — HEPATIC FUNCTION PANEL
ALK PHOS: 232 U/L — AB (ref 38–126)
ALT: 21 U/L (ref 0–44)
AST: 58 U/L — ABNORMAL HIGH (ref 15–41)
Albumin: 2.2 g/dL — ABNORMAL LOW (ref 3.5–5.0)
BILIRUBIN DIRECT: 0.1 mg/dL (ref 0.0–0.2)
BILIRUBIN TOTAL: 0.7 mg/dL (ref 0.3–1.2)
Indirect Bilirubin: 0.6 mg/dL (ref 0.3–0.9)
TOTAL PROTEIN: 5.8 g/dL — AB (ref 6.5–8.1)

## 2017-10-20 LAB — PHOSPHORUS: Phosphorus: 3.9 mg/dL (ref 2.5–4.6)

## 2017-10-20 LAB — FERRITIN: Ferritin: 406 ng/mL — ABNORMAL HIGH (ref 11–307)

## 2017-10-20 LAB — FOLATE: Folate: 8.9 ng/mL (ref >5.9)

## 2017-10-20 LAB — BRAIN NATRIURETIC PEPTIDE: B NATRIURETIC PEPTIDE 5: 127.4 pg/mL — AB (ref 0.0–100.0)

## 2017-10-20 LAB — VITAMIN B12: Vitamin B-12: 793 pg/mL (ref 180–914)

## 2017-10-20 MED ORDER — METHOCARBAMOL 500 MG PO TABS
500.0000 mg | ORAL_TABLET | Freq: Three times a day (TID) | ORAL | Status: DC | PRN
  Administered 2017-10-21 (×2): 500 mg via ORAL
  Filled 2017-10-20 (×2): qty 1

## 2017-10-20 MED ORDER — SENNA 8.6 MG PO TABS
1.0000 | ORAL_TABLET | Freq: Two times a day (BID) | ORAL | Status: DC
  Administered 2017-10-20 – 2017-10-23 (×6): 8.6 mg via ORAL
  Filled 2017-10-20 (×6): qty 1

## 2017-10-20 MED ORDER — ONDANSETRON HCL 4 MG/2ML IJ SOLN
4.0000 mg | Freq: Four times a day (QID) | INTRAMUSCULAR | Status: DC | PRN
  Administered 2017-10-21: 4 mg via INTRAVENOUS
  Filled 2017-10-20: qty 2

## 2017-10-20 MED ORDER — SODIUM CHLORIDE 0.9% IV SOLUTION
Freq: Once | INTRAVENOUS | Status: AC
  Administered 2017-10-21: 01:00:00 via INTRAVENOUS

## 2017-10-20 MED ORDER — SODIUM CHLORIDE 0.9 % IV SOLN
1.0000 g | INTRAVENOUS | Status: DC
  Filled 2017-10-20: qty 10

## 2017-10-20 MED ORDER — POLYETHYLENE GLYCOL 3350 17 G PO PACK: 17.0000 g | PACK | Freq: Every day | ORAL | Status: DC | PRN

## 2017-10-20 MED ORDER — POTASSIUM CHLORIDE 10 MEQ/100ML IV SOLN
10.0000 meq | INTRAVENOUS | Status: AC
  Administered 2017-10-20 – 2017-10-21 (×4): 10 meq via INTRAVENOUS
  Filled 2017-10-20 (×4): qty 100

## 2017-10-20 MED ORDER — SODIUM CHLORIDE 0.9 % IV SOLN
1.0000 g | Freq: Once | INTRAVENOUS | Status: AC
  Administered 2017-10-20: 1 g via INTRAVENOUS
  Filled 2017-10-20: qty 10

## 2017-10-20 MED ORDER — QUETIAPINE FUMARATE 50 MG PO TABS
100.0000 mg | ORAL_TABLET | Freq: Every day | ORAL | Status: DC
  Administered 2017-10-20 – 2017-10-23 (×4): 100 mg via ORAL
  Filled 2017-10-20 (×4): qty 2

## 2017-10-20 MED ORDER — ACETAMINOPHEN 325 MG PO TABS
650.0000 mg | ORAL_TABLET | Freq: Four times a day (QID) | ORAL | Status: DC | PRN
  Administered 2017-10-21 (×2): 650 mg via ORAL
  Filled 2017-10-20 (×2): qty 2

## 2017-10-20 MED ORDER — POTASSIUM CHLORIDE CRYS ER 20 MEQ PO TBCR
40.0000 meq | EXTENDED_RELEASE_TABLET | Freq: Once | ORAL | Status: AC
  Administered 2017-10-20: 40 meq via ORAL
  Filled 2017-10-20: qty 2

## 2017-10-20 MED ORDER — SERTRALINE HCL 50 MG PO TABS
25.0000 mg | ORAL_TABLET | Freq: Every day | ORAL | Status: DC
  Administered 2017-10-20 – 2017-10-23 (×4): 25 mg via ORAL
  Filled 2017-10-20 (×4): qty 1

## 2017-10-20 MED ORDER — PRAVASTATIN SODIUM 20 MG PO TABS
20.0000 mg | ORAL_TABLET | Freq: Every day | ORAL | Status: DC
  Administered 2017-10-21: 20 mg via ORAL
  Filled 2017-10-20: qty 1

## 2017-10-20 MED ORDER — ONDANSETRON HCL 4 MG PO TABS: 4.0000 mg | ORAL_TABLET | Freq: Four times a day (QID) | ORAL | Status: DC | PRN

## 2017-10-20 MED ORDER — SODIUM CHLORIDE 0.9 % IV SOLN
INTRAVENOUS | Status: DC
  Administered 2017-10-20: via INTRAVENOUS

## 2017-10-20 MED ORDER — BISACODYL 10 MG RE SUPP: 10.0000 mg | Freq: Every day | RECTAL | Status: DC | PRN

## 2017-10-20 MED ORDER — MIRTAZAPINE 15 MG PO TABS
30.0000 mg | ORAL_TABLET | Freq: Every day | ORAL | Status: DC
  Administered 2017-10-20 – 2017-10-23 (×4): 30 mg via ORAL
  Filled 2017-10-20 (×4): qty 2

## 2017-10-20 MED ORDER — METOPROLOL SUCCINATE ER 50 MG PO TB24
50.0000 mg | ORAL_TABLET | Freq: Every day | ORAL | Status: DC
  Administered 2017-10-21 – 2017-10-23 (×3): 50 mg via ORAL
  Filled 2017-10-20 (×3): qty 1

## 2017-10-20 MED ORDER — DONEPEZIL HCL 5 MG PO TABS
5.0000 mg | ORAL_TABLET | Freq: Every day | ORAL | Status: DC
  Administered 2017-10-20 – 2017-10-23 (×4): 5 mg via ORAL
  Filled 2017-10-20 (×4): qty 1

## 2017-10-20 MED ORDER — ACETAMINOPHEN 650 MG RE SUPP: 650.0000 mg | Freq: Four times a day (QID) | RECTAL | Status: DC | PRN

## 2017-10-20 MED ORDER — IOPAMIDOL (ISOVUE-370) INJECTION 76%
INTRAVENOUS | Status: AC
  Administered 2017-10-20: 60 mL
  Filled 2017-10-20: qty 100

## 2017-10-20 MED ORDER — AMLODIPINE BESYLATE 10 MG PO TABS
10.0000 mg | ORAL_TABLET | Freq: Every day | ORAL | Status: DC
  Administered 2017-10-21 – 2017-10-23 (×3): 10 mg via ORAL
  Filled 2017-10-20 (×3): qty 1

## 2017-10-20 NOTE — ED Provider Notes (Signed)
I assumed care of this patient from Dr. Roderic Palau at 5 pm.  Please see their note for further details of Hx, PE.  Briefly patient is a 77 y.o. female who presented with fatigue, found to have low hemoglobin but no melena and neg occult testing, patient with 3 gram drop over last several weeks, had elevated D dimer at nursing facility and will f/u CT PE study prior to admission.  Patient with possible urinary tract infection and given IV Rocephin.  CT PE study shows no acute PE.  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  There appears to be a possible right breast mass.  Patient has small bilateral pleural effusions.  Has known compression fractures in the thoracic and lumbar spine which could be pathologic.  Suspect patient likely with undiagnosed oncology process causing anemia and general fatigue.  Patient to be admitted to family practice for further work-up and care.  Hemodynamically stable throughout my care.  This chart was dictated using voice recognition software.  Despite best efforts to proofread,  errors can occur which can change the documentation meaning.       Lennice Sites, DO 10/20/17 2017

## 2017-10-20 NOTE — ED Triage Notes (Signed)
Pt from [facility] via ems; pt c/o increased lethargy x 2 days; pt's labs drawn and chest x ray done at facility last night, elevated d dimer; increase fluid and swelling in legs and arms x 1 day; pt states she used to take lasix, but that they took her off of it; endorses increased confusion (oriented to place, and person, but not year); no pain cp, no sob, no dizziness  84-86% ems arrival on RA, placed on 2 L [place] 96% 154/78 HR 90 CBG 125 RR16

## 2017-10-20 NOTE — ED Notes (Signed)
Pt returned from xray

## 2017-10-20 NOTE — ED Notes (Signed)
Back from xray.  Refused to have xray.  Explained reason behind test.  Agreed.  Taken back at this time.

## 2017-10-20 NOTE — H&P (Signed)
Monique Rush NTI:[id number] DOB: [birth year] DOA: 10/20/2017     PCP: Benito Mccreedy, MD   Outpatient Specialists:  NONE    Patient arrived to ER on 10/20/17 at 0928  Patient coming from:   From facility [facility] home) SNF  Chief Complaint:  Chief Complaint  Patient presents with  . Weakness    HPI: Monique Rush is a 77 y.o. female with medical history significant of HTN, dementia    Presented with   Malaise, fatigue Patient was recently admitted for acute encephalopathy and was found to have UTI and hypercalcemia blood cultures showed gram-positive rods which felt to be contaminant patient completed antibiotics, nursing hospital calcium is improved with IV fluids thought to be secondary hydrochlorothiazide initially admitted from 6 to 12 August. Patient was noted to be anemic during last admission anemia panel at that time unremarkable no active bleeding noted felt that her hemoglobin drop was secondary to IV fluids  Patient pleasantly confused uanble to provide3 hx but does report abdominal pain.    Regarding pertinent Chronic problems: Pretension on metoprolol and amlodipine hold hydrochlorothiazide has been recently discontinued secondary to hypercalcemia    While in ER: Noted to have Hg down 10.1->8.6->7.2 plt 136 Alb 2.2 Alk Phosph 232 d.dimer was elevated and CTA was ordered  CTA showed right breast mass, patholic fractures  The following Work up has been ordered so far:  Orders Placed This Encounter  Procedures  . Urine Culture  . DG Chest 2 View  . CT Angio Chest PE W and/or Wo Contrast  . Basic metabolic panel  . CBC  . Urinalysis, Routine w reflex microscopic  . Hepatic function panel  . Brain natriuretic peptide  . Cardiac monitoring  . Saline Lock IV, Maintain IV access  . In and Out Cath  . Consult to hospitalist  . Pulse oximetry, continuous  . CBG monitoring, ED  . POC occult blood, ED Provider will collect  . ED EKG  .  EKG 12-Lead  . Type and screen  . ABO/Rh      Following Medications were ordered in ER: Medications  potassium chloride SA (K-DUR,KLOR-CON) CR tablet 40 mEq (40 mEq Oral Given 10/20/17 1331)  cefTRIAXone (ROCEPHIN) 1 g in sodium chloride 0.9 % 100 mL IVPB (0 g Intravenous Stopped 10/20/17 1508)  iopamidol (ISOVUE-370) 76 % injection (60 mLs  Contrast Given 10/20/17 1545)    Significant initial  Findings: Abnormal Labs Reviewed  BASIC METABOLIC PANEL - Abnormal; Notable for the following components:      Result Value   Potassium 3.1 (*)    Glucose, Bld 100 (*)    All other components within normal limits  CBC - Abnormal; Notable for the following components:   RBC 2.65 (*)    Hemoglobin 7.2 (*)    HCT 24.2 (*)    MCHC 29.8 (*)    RDW 17.0 (*)    Platelets 136 (*)    All other components within normal limits  URINALYSIS, ROUTINE W REFLEX MICROSCOPIC - Abnormal; Notable for the following components:   Hgb urine dipstick SMALL (*)    Leukocytes, UA SMALL (*)    Bacteria, UA RARE (*)    All other components within normal limits  HEPATIC FUNCTION PANEL - Abnormal; Notable for the following components:   Total Protein 5.8 (*)    Albumin 2.2 (*)    AST 58 (*)    Alkaline Phosphatase 232 (*)    All other components  within normal limits     Na 137 K  3.1  Cr stable,   Lab Results  Component Value Date   CREATININE 0.83 10/20/2017   CREATININE 0.89 10/01/2017   CREATININE 1.08 (H) 09/29/2017      WBC  10.5  HG/HCT Down   from baseline see below    Component Value Date/Time   HGB 7.2 (L) 10/20/2017 1031   HCT 24.2 (L) 10/20/2017 1031       Troponin (Point of Care Test) No results for input(s): TROPIPOC in the last 72 hours.     BNP (last 3 results) No results for input(s): BNP in the last 8760 hours.  ProBNP (last 3 results) No results for input(s): PROBNP in the last 8760 hours.  Lactic Acid, Venous    Component Value Date/Time   LATICACIDVEN 1.75  09/25/2017 1048   Ct angio Chest: No evidence of PE Bilateral lung Nodules Right breast mass Compression fractures involving T8, T10, T12 and L1.   UA   no evidence of UTI       CXR - NON acute   ECG:  Personally reviewed by me showing: HR : 95 Rhythm: NSR,     no evidence of ischemic changes QTC 467    ED Triage Vitals  Enc Vitals Group     BP 10/20/17 0940 (!) 116/58     Pulse Rate 10/20/17 0940 96     Resp 10/20/17 0940 19     Temp 10/20/17 0940 97.9 F (36.6 C)     Temp Source 10/20/17 0940 Oral     SpO2 10/20/17 0940 98 %     Weight 10/20/17 0941 115 lb (52.2 kg)     Height 10/20/17 0941 '5\' 9"'  (1.753 m)     Head Circumference --      Peak Flow --      Pain Score 10/20/17 0940 0     Pain Loc --      Pain Edu? --      Excl. in [place]? --   TMAX(24)@       Latest  Blood pressure 130/67, pulse (!) 101, temperature 97.9 F (36.6 C), temperature source Oral, resp. rate 16, height '5\' 9"'  (1.753 m), weight 52.2 kg, SpO2 96 %.    Hospitalist was called for admission for Symptomatic anemia   Review of Systems:    Pertinent positives include: confusion, debility , fatigue  Constitutional:  No weight loss, night sweats, Fevers, chills,   weight loss  HEENT:  No headaches, Difficulty swallowing,Tooth/dental problems,Sore throat,  No sneezing, itching, ear ache, nasal congestion, post nasal drip,  Cardio-vascular:  No chest pain, Orthopnea, PND, anasarca, dizziness, palpitations.no Bilateral lower extremity swelling  GI:  No heartburn, indigestion, abdominal pain, nausea, vomiting, diarrhea, change in bowel habits, loss of appetite, melena, blood in stool, hematemesis Resp:  no shortness of breath at rest. No dyspnea on exertion, No excess mucus, no productive cough, No non-productive cough, No coughing up of blood.No change in color of mucus.No wheezing. Skin:  no rash or lesions. No jaundice GU:  no dysuria, change in color of urine, no urgency or frequency. No  straining to urinate.  No flank pain.  Musculoskeletal:  No joint pain or no joint swelling. No decreased range of motion. No back pain.  Psych:  No change in mood or affect. No depression or anxiety. No memory loss.  Neuro: no localizing neurological complaints, no tingling, no weakness, no double vision, no gait abnormality, no  slurred speech   All systems reviewed and apart from [place] all are negative  Past Medical History:   Past Medical History:  Diagnosis Date  . Encephalopathy   . Hypertension   . Insomnia   . Urine retention       Past Surgical History:  Procedure Laterality Date  . ANKLE SURGERY     Left  . CHOLECYSTECTOMY    . HIP ARTHROPLASTY Left 02/11/2014   Procedure: ARTHROPLASTY BIPOLAR HIP;  Surgeon: Mauri Pole, MD;  Location: [facility];  Service: Orthopedics;  Laterality: Left;    Social History:       reports that she has never smoked. She has never used smokeless tobacco. She reports that she does not drink alcohol or use drugs.     Family History:   Family History  Problem Relation Age of Onset  . Alzheimer's disease Mother   . Heart disease Father     Allergies: Allergies  Allergen Reactions  . Morphine And Related Nausea And Vomiting  . Oxycontin [Oxycodone Hcl] Nausea And Vomiting  . Percocet [Oxycodone-Acetaminophen] Nausea And Vomiting     Prior to Admission medications   Medication Sig Start Date End Date Taking? Authorizing Provider  acetaminophen (TYLENOL) 325 MG tablet Take 650 mg by mouth every 8 (eight) hours as needed (pain in left hip).   Yes [provider]  Amino Acids-Protein Hydrolys (FEEDING SUPPLEMENT, PRO-STAT SUGAR FREE 64,) LIQD Take 30 mLs by mouth 2 (two) times daily.   Yes [provider]  amLODipine (NORVASC) 10 MG tablet Take 1 tablet (10 mg total) by mouth daily. 10/01/17  Yes Mariel Aloe, MD  donepezil (ARICEPT) 5 MG tablet Take 5 mg by mouth at bedtime.   Yes [provider]    doxycycline (VIBRA-TABS) 100 MG tablet Take 100 mg by mouth 2 (two) times daily.   Yes [provider]  magnesium hydroxide (MILK OF MAGNESIA) 400 MG/5ML suspension Take 30 mLs by mouth daily as needed for mild constipation.   Yes [provider]  methocarbamol (ROBAXIN) 500 MG tablet Take 500 mg by mouth every 8 (eight) hours as needed for muscle spasms.   Yes [provider]  metoprolol succinate (TOPROL-XL) 50 MG 24 hr tablet Take 50 mg by mouth daily. Take with or immediately following a meal.   Yes [provider]  mirtazapine (REMERON) 30 MG tablet Take 1 tablet (30 mg total) by mouth at bedtime. 10/01/17  Yes Mariel Aloe, MD  polyethylene glycol (MIRALAX / GLYCOLAX) packet Take 17 g by mouth daily as needed. Patient taking differently: Take 17 g by mouth daily as needed for mild constipation.  10/01/17  Yes Mariel Aloe, MD  pravastatin (PRAVACHOL) 20 MG tablet Take 20 mg by mouth daily.   Yes [provider]  QUEtiapine (SEROQUEL) 100 MG tablet Take 1 tablet (100 mg total) by mouth at bedtime. For sleep 10/01/17  Yes Mariel Aloe, MD  sertraline (ZOLOFT) 25 MG tablet Take 25 mg by mouth daily.   Yes [provider]  diclofenac sodium (VOLTAREN) 1 % GEL Apply 2 g topically 4 (four) times daily. 10/01/17   Mariel Aloe, MD  feeding supplement, ENSURE ENLIVE, (ENSURE ENLIVE) LIQD Take 237 mLs by mouth 2 (two) times daily between meals. 10/01/17   Mariel Aloe, MD   Physical Exam: Blood pressure 130/67, pulse (!) 101, temperature 97.9 F (36.6 C), temperature source Oral, resp. rate 16, height '5\' 9"'  (1.753  m), weight 52.2 kg, SpO2 96 %. 1. General:  in No Acute distress  Chronically ill  -appearing 2. Psychological: Alert and   Oriented to self only 3. Head/ENT:    Dry Mucous Membranes                          Head Non traumatic, neck supple                            Poor Dentition 4. SKIN:   decreased Skin turgor,  Skin  clean Dry and intact no rash 5. Heart: Regular rate and rhythm no  Murmur, no Rub or gallop 6. Lungs:  no wheezes or crackles   7. Abdomen: Soft, epigastric tenderness, Non distended  bowel sounds present 8. Lower extremities: no clubbing, cyanosis, or trace edema 9. Neurologically Grossly intact, moving all 4 extremities equally   10. MSK: Normal range of motion   LABS:     Recent Labs  Lab 10/20/17 1031  WBC 10.5  HGB 7.2*  HCT 24.2*  MCV 91.3  PLT 462*   Basic Metabolic Panel: Recent Labs  Lab 10/20/17 1031  NA 137  K 3.1*  CL 99  CO2 24  GLUCOSE 100*  BUN 19  CREATININE 0.83  CALCIUM 9.1      Recent Labs  Lab 10/20/17 1031  AST 58*  ALT 21  ALKPHOS 232*  BILITOT 0.7  PROT 5.8*  ALBUMIN 2.2*   No results for input(s): LIPASE, AMYLASE in the last 168 hours. No results for input(s): AMMONIA in the last 168 hours.    HbA1C: No results for input(s): HGBA1C in the last 72 hours. CBG: Recent Labs  Lab 10/20/17 1057  GLUCAP 94      Urine analysis:    Component Value Date/Time   COLORURINE YELLOW 10/20/2017 1215   APPEARANCEUR CLEAR 10/20/2017 1215   LABSPEC 1.009 10/20/2017 1215   PHURINE 7.0 10/20/2017 1215   GLUCOSEU NEGATIVE 10/20/2017 1215   HGBUR SMALL (A) 10/20/2017 1215   BILIRUBINUR NEGATIVE 10/20/2017 1215   KETONESUR NEGATIVE 10/20/2017 1215   PROTEINUR NEGATIVE 10/20/2017 1215   UROBILINOGEN 1.0 02/20/2014 1520   NITRITE NEGATIVE 10/20/2017 1215   LEUKOCYTESUR SMALL (A) 10/20/2017 1215    Cultures:    Component Value Date/Time   SDES  09/25/2017 0954    BLOOD LEFT FOREARM Performed at [facility], [place] [address] [place], [place] [address]    SPECREQUEST  09/25/2017 [phone]    BOTTLES DRAWN AEROBIC AND ANAEROBIC Blood Culture adequate volume Performed at [facility] [address], [place], [place] [address]    CULT (A) 09/25/2017 3716    BACILLUS SPECIES Standardized susceptibility  testing for this organism is not available. Performed at [facility], [place] [address], [place], [place] [address]    REPTSTATUS 09/29/2017 FINAL 09/25/2017 0954     Radiological Exams on Admission: Dg Chest 2 View  Result Date: 10/20/2017 CLINICAL DATA:  Increasing lethargy over the past 2 days. EXAM: CHEST - 2 VIEW COMPARISON:  PA and lateral chest 09/25/2017 and 02/10/2014. FINDINGS: Lung volumes are somewhat low but the lungs are clear. Heart size is normal. No pneumothorax or pleural fluid. Remote compression fracture at the thoracolumbar junction is noted. No acute bony abnormality. IMPRESSION: No acute disease. Electronically Signed   By: Inge Rise M.D.   On: 10/20/2017 11:14  Ct Angio Chest Pe W And/or Wo Contrast  Result Date: 10/20/2017 CLINICAL DATA:  Hypoxemia. Elevated D-dimer. BILATERAL UPPER and LOWER extremity edema. EXAM: CT ANGIOGRAPHY CHEST WITH CONTRAST TECHNIQUE: Multidetector CT imaging of the chest was performed using the standard protocol during bolus administration of intravenous contrast. Multiplanar CT image reconstructions and MIPs were obtained to evaluate the vascular anatomy. CONTRAST:  63m ISOVUE-370 IOPAMIDOL INJECTION 76% IV. COMPARISON:  None. FINDINGS: Beam hardening streak artifact is present as the patient was unable to raise the LEFT arm. Cardiovascular: Contrast opacification of pulmonary arteries is very good. No filling defects within either main pulmonary artery or their segmental branches in either lung to suggest pulmonary embolism. Heart mildly enlarged. LEFT ventricular hypertrophy. Moderate to severe LAD and RIGHT coronary artery atherosclerosis. No pericardial effusion. Moderate to severe atherosclerosis involving the thoracic and proximal abdominal aorta with calcified and noncalcified plaque. No evidence of aortic aneurysm. Mediastinum/Nodes: 2 adjacent enlarged RIGHT axillary lymph nodes measuring approximately 2.7 x 1.4 cm (series 5,  image 73) and 2.3 x 1.5 cm (image 54). No pathologic LEFT axillary, mediastinal or hilar lymphadenopathy. Moderate-sized hiatal hernia. Normal appearing esophagus. Approximate 1.2 cm LEFT lobe thyroid nodule. Lungs/Pleura: Numerous BILATERAL lung nodules. Focal bronchiectasis involving the LEFT UPPER LOBE and the SUPERIOR segment LEFT LOWER LOBE. Emphysematous changes in both lungs. No confluent airspace consolidation. Small BILATERAL pleural effusions and associated mild passive atelectasis in the lower lobes. Central airways patent with marked bronchial wall thickening. Upper Abdomen: Contrast is present in the visualized renal collecting systems likely related to an earlier test injection. Visualized upper abdomen unremarkable for the early arterial phase of enhancement. Musculoskeletal: Thoracolumbar levoscoliosis. Multiple compression fractures which are not likely acute, including T8, T10, T12, and L1. Sclerosis is present in each of these vertebral bodies. Mixed lytic and sclerotic lesions are present in the sternum. Other: I am suspicious that there is an incompletely imaged mass in the RIGHT breast (series 5, images 122-125). Review of the MIP images confirms the above findings. IMPRESSION: 1. No evidence of pulmonary embolism. 2. RIGHT axillary lymphadenopathy.  Index nodes are measured above. 3. Numerous BILATERAL lung nodules indicating metastatic disease. 4. I am suspicious that there is an incompletely imaged mass in the RIGHT breast which would account for the RIGHT axillary lymphadenopathy. Diagnostic mammography and possible ultrasound is recommended in further evaluation. 5. Small BILATERAL pleural effusions and associated mild passive atelectasis in the lower lobes. 6. Compression fractures involving T8, T10, T12 and L1. As there is sclerosis in each of these vertebral bodies, these may be pathologic and related to metastatic disease. Mixed lytic and sclerotic metastases are present in the  sternum. 7. Moderate-sized hiatal hernia. Aortic Atherosclerosis (ICD10-I70.0) and Emphysema (ICD10-J43.9). Electronically Signed   By: TEvangeline DakinM.D.   On: 10/20/2017 17:15    Chart has been reviewed    Assessment/Plan  77y.o. female with medical history significant of HTN, dementia  Admitted for acute anemia and likely newly diagnosed breast cancer with multiple metastases  Present on Admission: . Anemia -hematocrit anemia will transfuse 1 unit obtain anemia panel follow serial CBC Hemoccult stool . Hypokalemia -replace check magnesium level follow on telemetry . Abdominal pain -plain imaging showing multiple kidney stones patient unable to provide more detailed history.  Will obtain CT of abdomen and pelvis to evaluate for source of abdominal pain and tenderness  . Breast mass will need to have follow-up arranged with oncology given strong possibility of metastatic breast cancer given multiple  lung nodules sclerosing possibly pathologic fractures of the vertebral and   breast mass . Multiple lung nodules will need follow-up of oncology most likely metastatic disease from undiagnosed breast cancer.  Attempted to contact family members as patient has significant dementia and unable to provide history or participate in decision-making at this point was unable to contact family will need to reattempt in the morning.  Dementia will monitor for any sundowning once able to tolerate p.o. will restart Aricept  History of hypertension -restart beta-blocker changed to metoprolol holding parameters  History of hyperlipidemia continue pravastatin  Other plan as per orders.  DVT prophylaxis:  SCD    Code Status:  FULL CODE  As per paper work no family at bedside she has dementia at baseline unable to provide history or answer CODE STATUS questions  Family Communication:   Family not  at  Bedside   Disposition Plan:      Back to current facility when stable                          Social Work  consulted                   Nutrition    consulted                                   Consults called: Please consult oncology in a.m. will need set up follow up  Admission status:  Obs    Level of care    tele        Toy Baker 10/20/2017, 10:26 PM    [facility]  Pager [phone]   after 2 AM please page floor coverage PA If 7AM-7PM, please contact the day team taking care of the patient  [email or web address]  Password [email or web address]

## 2017-10-20 NOTE — ED Provider Notes (Signed)
[facility] EMERGENCY DEPARTMENT Provider Note   CSN: [id number] Arrival date & time: 10/20/17  3419     History   Chief Complaint Chief Complaint  Patient presents with  . Weakness    HPI Monique Rush is a 77 y.o. female.  Patient brought to the emergency department for more lethargy and weakness.  Patient had labs done in the nursing home that showed an elevated d-dimer  The history is provided by the patient and the nursing home. No language interpreter was used.  Weakness  Primary symptoms include no focal weakness. This is a new problem. The current episode started 2 days ago. The problem has not changed since onset.There was no focality noted. There has been no fever. Pertinent negatives include no shortness of breath. There were no medications administered prior to arrival. Associated medical issues do not include trauma.    Past Medical History:  Diagnosis Date  . Encephalopathy   . Hypertension   . Insomnia   . Urine retention     Patient Active Problem List   Diagnosis Date Noted  . Compression fracture of body of thoracic vertebra ([place]) 10/01/2017  . Delirium 10/01/2017  . Acute metabolic encephalopathy 37/90/2409  . Urinary tract infection without hematuria   . Fracture of femoral neck, left ([place]) 02/12/2014  . PRES (posterior reversible encephalopathy syndrome) 02/11/2014  . Encephalopathy 02/11/2014  . Leukocytosis 02/11/2014  . Dehydration 02/11/2014    Past Surgical History:  Procedure Laterality Date  . ANKLE SURGERY     Left  . CHOLECYSTECTOMY    . HIP ARTHROPLASTY Left 02/11/2014   Procedure: ARTHROPLASTY BIPOLAR HIP;  Surgeon: Mauri Pole, MD;  Location: [facility];  Service: Orthopedics;  Laterality: Left;     OB History   None      Home Medications    Prior to Admission medications   Medication Sig Start Date End Date Taking? Authorizing Provider  acetaminophen (TYLENOL) 325 MG tablet Take 650 mg by mouth every  8 (eight) hours as needed (pain in left hip).   Yes [provider]  Amino Acids-Protein Hydrolys (FEEDING SUPPLEMENT, PRO-STAT SUGAR FREE 64,) LIQD Take 30 mLs by mouth 2 (two) times daily.   Yes [provider]  amLODipine (NORVASC) 10 MG tablet Take 1 tablet (10 mg total) by mouth daily. 10/01/17  Yes Mariel Aloe, MD  donepezil (ARICEPT) 5 MG tablet Take 5 mg by mouth at bedtime.   Yes [provider]  doxycycline (VIBRA-TABS) 100 MG tablet Take 100 mg by mouth 2 (two) times daily.   Yes [provider]  magnesium hydroxide (MILK OF MAGNESIA) 400 MG/5ML suspension Take 30 mLs by mouth daily as needed for mild constipation.   Yes [provider]  methocarbamol (ROBAXIN) 500 MG tablet Take 500 mg by mouth every 8 (eight) hours as needed for muscle spasms.   Yes [provider]  metoprolol succinate (TOPROL-XL) 50 MG 24 hr tablet Take 50 mg by mouth daily. Take with or immediately following a meal.   Yes [provider]  mirtazapine (REMERON) 30 MG tablet Take 1 tablet (30 mg total) by mouth at bedtime. 10/01/17  Yes Mariel Aloe, MD  polyethylene glycol (MIRALAX / GLYCOLAX) packet Take 17 g by mouth daily as needed. Patient taking differently: Take 17 g by mouth daily as needed for mild constipation.  10/01/17  Yes Mariel Aloe, MD  pravastatin (PRAVACHOL) 20 MG tablet Take 20 mg by mouth  daily.   Yes [provider]  QUEtiapine (SEROQUEL) 100 MG tablet Take 1 tablet (100 mg total) by mouth at bedtime. For sleep 10/01/17  Yes Mariel Aloe, MD  sertraline (ZOLOFT) 25 MG tablet Take 25 mg by mouth daily.   Yes [provider]  diclofenac sodium (VOLTAREN) 1 % GEL Apply 2 g topically 4 (four) times daily. 10/01/17   Mariel Aloe, MD  feeding supplement, ENSURE ENLIVE, (ENSURE ENLIVE) LIQD Take 237 mLs by mouth 2 (two) times daily between meals. 10/01/17   Mariel Aloe, MD    Family History Family History    Problem Relation Age of Onset  . Alzheimer's disease Mother   . Heart disease Father     Social History Social History   Tobacco Use  . Smoking status: Never Smoker  . Smokeless tobacco: Never Used  Substance Use Topics  . Alcohol use: No    Alcohol/week: 0.0 standard drinks  . Drug use: No     Allergies   Morphine and related; Oxycontin [oxycodone hcl]; and Percocet [oxycodone-acetaminophen]   Review of Systems Review of Systems  Unable to perform ROS: Dementia  Respiratory: Negative for shortness of breath.   Neurological: Positive for weakness. Negative for focal weakness.     Physical Exam Updated Vital Signs BP 137/73 (BP Location: Right Arm)   Pulse 100   Temp 97.9 F (36.6 C) (Oral)   Resp 15   Ht 5\' 9"  (1.753 m)   Wt 52.2 kg   SpO2 94%   BMI 16.98 kg/m   Physical Exam  Constitutional: She appears well-developed.  HENT:  Head: Normocephalic.  Eyes: Conjunctivae and EOM are normal. No scleral icterus.  Neck: Neck supple. No thyromegaly present.  Cardiovascular: Normal rate and regular rhythm. Exam reveals no gallop and no friction rub.  No murmur heard. Pulmonary/Chest: No stridor. She has no wheezes. She has no rales. She exhibits no tenderness.  Abdominal: She exhibits no distension. There is no tenderness. There is no rebound.  Musculoskeletal: Normal range of motion. She exhibits no edema.  1+ edema in ankles  Lymphadenopathy:    She has no cervical adenopathy.  Neurological: She is alert. She exhibits normal muscle tone. Coordination normal.  Oriented to person only  Skin: No rash noted. No erythema.     ED Treatments / Results  Labs (all labs ordered are listed, but only abnormal results are displayed) Labs Reviewed  BASIC METABOLIC PANEL - Abnormal; Notable for the following components:      Result Value   Potassium 3.1 (*)    Glucose, Bld 100 (*)    All other components within normal limits  CBC - Abnormal; Notable for the  following components:   RBC 2.65 (*)    Hemoglobin 7.2 (*)    HCT 24.2 (*)    MCHC 29.8 (*)    RDW 17.0 (*)    Platelets 136 (*)    All other components within normal limits  URINALYSIS, ROUTINE W REFLEX MICROSCOPIC - Abnormal; Notable for the following components:   Hgb urine dipstick SMALL (*)    Leukocytes, UA SMALL (*)    Bacteria, UA RARE (*)    All other components within normal limits  HEPATIC FUNCTION PANEL - Abnormal; Notable for the following components:   Total Protein 5.8 (*)    Albumin 2.2 (*)    AST 58 (*)    Alkaline Phosphatase 232 (*)    All other components within normal limits  URINE CULTURE  CBG MONITORING, ED    EKG EKG Interpretation  Date/Time:  Saturday October 20 2017 09:46:10 EDT Ventricular Rate:  95 PR Interval:    QRS Duration: 94 QT Interval:  371 QTC Calculation: 467 R Axis:   49 Text Interpretation:  Sinus rhythm Abnormal inferior Q waves Confirmed by Milton Ferguson [phone]) on 10/20/2017 4:16:52 PM   Radiology Dg Chest 2 View  Result Date: 10/20/2017 CLINICAL DATA:  Increasing lethargy over the past 2 days. EXAM: CHEST - 2 VIEW COMPARISON:  PA and lateral chest 09/25/2017 and 02/10/2014. FINDINGS: Lung volumes are somewhat low but the lungs are clear. Heart size is normal. No pneumothorax or pleural fluid. Remote compression fracture at the thoracolumbar junction is noted. No acute bony abnormality. IMPRESSION: No acute disease. Electronically Signed   By: Inge Rise M.D.   On: 10/20/2017 11:14    Procedures Procedures (including critical care time)  Medications Ordered in ED Medications  potassium chloride SA (K-DUR,KLOR-CON) CR tablet 40 mEq (40 mEq Oral Given 10/20/17 1331)  cefTRIAXone (ROCEPHIN) 1 g in sodium chloride 0.9 % 100 mL IVPB (0 g Intravenous Stopped 10/20/17 1508)  iopamidol (ISOVUE-370) 76 % injection (60 mLs  Contrast Given 10/20/17 1545)     Initial Impression / Assessment and Plan / ED Course  I have reviewed  the triage vital signs and the nursing notes.  Pertinent labs & imaging results that were available during my care of the patient were reviewed by me and considered in my medical decision making (see chart for details).     Patient with anemia and urinary tract infection.  She will be admitted  Final Clinical Impressions(s) / ED Diagnoses   Final diagnoses:  None    ED Discharge Orders    None       Milton Ferguson, MD 10/21/17 [phone]

## 2017-10-20 NOTE — ED Notes (Signed)
Taken to xray.

## 2017-10-20 NOTE — ED Notes (Signed)
Pt's O2 sats observed to be in the mid to upper 80's on room air; pt placed on 2L O2 via [place]; O2 sats now in the 90's

## 2017-10-21 ENCOUNTER — Observation Stay (HOSPITAL_COMMUNITY): Payer: Medicare Other

## 2017-10-21 ENCOUNTER — Encounter (HOSPITAL_COMMUNITY): Payer: Self-pay | Admitting: General Practice

## 2017-10-21 DIAGNOSIS — R918 Other nonspecific abnormal finding of lung field: Secondary | ICD-10-CM | POA: Diagnosis not present

## 2017-10-21 DIAGNOSIS — N63 Unspecified lump in unspecified breast: Secondary | ICD-10-CM | POA: Diagnosis not present

## 2017-10-21 DIAGNOSIS — D649 Anemia, unspecified: Secondary | ICD-10-CM | POA: Diagnosis not present

## 2017-10-21 DIAGNOSIS — E876 Hypokalemia: Secondary | ICD-10-CM | POA: Diagnosis not present

## 2017-10-21 LAB — URINE CULTURE: CULTURE: NO GROWTH

## 2017-10-21 LAB — COMPREHENSIVE METABOLIC PANEL
ALK PHOS: 262 U/L — AB (ref 38–126)
ALT: 23 U/L (ref 0–44)
AST: 74 U/L — AB (ref 15–41)
Albumin: 2.4 g/dL — ABNORMAL LOW (ref 3.5–5.0)
Anion gap: 12 (ref 5–15)
BILIRUBIN TOTAL: 1.1 mg/dL (ref 0.3–1.2)
BUN: 14 mg/dL (ref 8–23)
CALCIUM: 9.2 mg/dL (ref 8.9–10.3)
CO2: 24 mmol/L (ref 22–32)
CREATININE: 0.79 mg/dL (ref 0.44–1.00)
Chloride: 103 mmol/L (ref 98–111)
GFR calc non Af Amer: >60 mL/min (ref >60)
Glucose, Bld: 82 mg/dL (ref 70–99)
Potassium: 4.4 mmol/L (ref 3.5–5.1)
Sodium: 139 mmol/L (ref 135–145)
Total Protein: 6.2 g/dL — ABNORMAL LOW (ref 6.5–8.1)

## 2017-10-21 LAB — TSH: TSH: 2.907 u[IU]/mL (ref 0.350–4.500)

## 2017-10-21 LAB — CBC
HCT: 31.1 % — ABNORMAL LOW (ref 36.0–46.0)
Hemoglobin: 9.5 g/dL — ABNORMAL LOW (ref 12.0–15.0)
MCH: 27.5 pg (ref 26.0–34.0)
MCHC: 30.5 g/dL (ref 30.0–36.0)
MCV: 90.1 fL (ref 78.0–100.0)
PLATELETS: 114 10*3/uL — AB (ref 150–400)
RBC: 3.45 MIL/uL — AB (ref 3.87–5.11)
RDW: 16.9 % — ABNORMAL HIGH (ref 11.5–15.5)
WBC: 10 10*3/uL (ref 4.0–10.5)

## 2017-10-21 LAB — MAGNESIUM: Magnesium: 1.8 mg/dL (ref 1.7–2.4)

## 2017-10-21 LAB — PHOSPHORUS: Phosphorus: 3.7 mg/dL (ref 2.5–4.6)

## 2017-10-21 LAB — PREALBUMIN: PREALBUMIN: 6.6 mg/dL — AB (ref 18–38)

## 2017-10-21 LAB — MRSA PCR SCREENING: MRSA BY PCR: NEGATIVE

## 2017-10-21 MED ORDER — OXYCODONE HCL 5 MG PO TABS
2.5000 mg | ORAL_TABLET | Freq: Four times a day (QID) | ORAL | Status: DC | PRN
  Administered 2017-10-21 – 2017-10-23 (×4): 2.5 mg via ORAL
  Filled 2017-10-21 (×4): qty 1

## 2017-10-21 MED ORDER — IOPAMIDOL (ISOVUE-300) INJECTION 61%
INTRAVENOUS | Status: AC
  Filled 2017-10-21: qty 30

## 2017-10-21 NOTE — Progress Notes (Signed)
Received call from central telemetry of heart rate sustaining in 140s. Patient assisted x2 mod/max assist back to bed. Heart rate in 130s. MD at bedside and made aware. Metoprolol administered PO per schedule order. Will continue to monitor. Bartholomew Crews, RN

## 2017-10-21 NOTE — Evaluation (Signed)
Physical Therapy Evaluation Patient Details Name: Monique Rush MRN: [id number] DOB: [birth year] Today's Date: 10/21/2017   History of Present Illness  77 y.o. female with medical history significant of left THA (2015), compression fx thoracic vertebra, encephalopathy, HTN, and dementia. Presenting with malaise and fatigue.   Clinical Impression  Pt admitted with above diagnosis. Pt currently with functional limitations due to the deficits listed below (see PT Problem List). Patient presenting with decreased functional mobility secondary to cognitive deficits, imbalance, and generalized weakness. Transferring to chair with two person minimal assistance and walker; trunk flexed posture throughout. Recommending return to SNF. Will follow acutely.     Follow Up Recommendations SNF [facility])    Equipment Recommendations  None recommended by PT    Recommendations for Other Services       Precautions / Restrictions Precautions Precautions: Fall Restrictions Weight Bearing Restrictions: No      Mobility  Bed Mobility Overal bed mobility: Needs Assistance Bed Mobility: Supine to Sit     Supine to sit: Max assist     General bed mobility comments: Max due to pt with self limiting behavior stating "I want to lay back down." Pt demonstrating good strength.   Transfers Overall transfer level: Needs assistance Equipment used: Rolling walker (2 wheeled);None Transfers: Sit to/from [facility] Sit to Stand: Min assist;+2 safety/equipment Stand pivot transfers: Min assist;+2 safety/equipment       General transfer comment: Min A for iniation of power up and gaining balance in standing. Min A for faciltiating pivot towards recliner  Ambulation/Gait                Stairs            Wheelchair Mobility    Modified Rankin (Stroke Patients Only)       Balance Overall balance assessment: Needs assistance;History of Falls Sitting-balance support:  No upper extremity supported;Feet supported Sitting balance-Leahy Scale: Poor Sitting balance - Comments: Min A to prevent posterior lean   Standing balance support: Bilateral upper extremity supported;During functional activity Standing balance-Leahy Scale: Poor Standing balance comment: Reliant on UE support                             Pertinent Vitals/Pain Pain Assessment: Faces Faces Pain Scale: Hurts even more Pain Location: back Pain Descriptors / Indicators: Aching;Discomfort;Sore Pain Intervention(s): Monitored during session;Limited activity within patient's tolerance    Home Living Family/patient expects to be discharged to:: Skilled nursing facility                 Additional Comments: [name]    Prior Function Level of Independence: Needs assistance         Comments: Poor historian and unable to provide information. No family present.      Hand Dominance   Dominant Hand: Right    Extremity/Trunk Assessment   Upper Extremity Assessment Upper Extremity Assessment: Generalized weakness    Lower Extremity Assessment Lower Extremity Assessment: Generalized weakness    Cervical / Trunk Assessment Cervical / Trunk Assessment: Kyphotic  Communication   Communication: No difficulties  Cognition Arousal/Alertness: Awake/alert Behavior During Therapy: WFL for tasks assessed/performed Overall Cognitive Status: History of cognitive impairments - at baseline                                 General Comments: Baseline dementia  General Comments General comments (skin integrity, edema, etc.): NT present during session    Exercises     Assessment/Plan    PT Assessment Patient needs continued PT services  PT Problem List Decreased strength;Decreased balance;Decreased mobility;Decreased activity tolerance;Decreased cognition;Decreased knowledge of use of DME;Decreased safety awareness       PT Treatment  Interventions      PT Goals (Current goals can be found in the Care Plan section)  Acute Rehab PT Goals Patient Stated Goal: none stated PT Goal Formulation: Patient unable to participate in goal setting Time For Goal Achievement: 11/04/17 Potential to Achieve Goals: Fair    Frequency Min 2X/week   Barriers to discharge        Co-evaluation PT/OT/SLP Co-Evaluation/Treatment: Yes Reason for Co-Treatment: For patient/therapist safety;Necessary to address cognition/behavior during functional activity;To address functional/ADL transfers PT goals addressed during session: Mobility/safety with mobility OT goals addressed during session: ADL's and self-care       AM-PAC PT "6 Clicks" Daily Activity  Outcome Measure Difficulty turning over in bed (including adjusting bedclothes, sheets and blankets)?: A Little Difficulty moving from lying on back to sitting on the side of the bed? : A Lot Difficulty sitting down on and standing up from a chair with arms (e.g., wheelchair, bedside commode, etc,.)?: Unable Help needed moving to and from a bed to chair (including a wheelchair)?: A Little Help needed walking in hospital room?: A Lot Help needed climbing 3-5 steps with a railing? : A Lot 6 Click Score: 13    End of Session Equipment Utilized During Treatment: Gait belt Activity Tolerance: Patient limited by fatigue;Patient limited by pain     PT Visit Diagnosis: Muscle weakness (generalized) (M62.81);Difficulty in walking, not elsewhere classified (R26.2);History of falling (Z91.81)    Time: 1610-9604 PT Time Calculation (min) (ACUTE ONLY): 9 min   Charges:   PT Evaluation $PT Eval Moderate Complexity: 1 Mod          Ellamae Sia, PT, DPT Acute Rehabilitation Services  Pager: [phone]   Willy Eddy 10/21/2017, 11:02 AM

## 2017-10-21 NOTE — Evaluation (Signed)
Occupational Therapy Evaluation Patient Details Name: Monique Rush MRN: [id number] DOB: 1940/06/02 Today's Date: 10/21/2017    History of Present Illness 77 y.o. female with medical history significant of left THA (2015), compression fx thoracic vertebra, encephalopathy, HTN, and dementia. Presenting with malaise and fatigue.     Clinical Impression   Per chart review, pt was residing at [facility]. Due to poor historian with cognitive deficits, pt unable to report PLOF and no family present to provide PLOF. Pt currently requiring Min A for ADLs and Min A +2 for functional transfers as well as Max cues throughout session. Pt limited by decreased balance and cognition. Pt would benefit from further acute OT to facilitate safe dc. Recommend dc to SNF for further OT to optimize safety, independence with ADLs, and return to PLOF.      Follow Up Recommendations  SNF    Equipment Recommendations  (Defer to next venue)    Recommendations for Other Services PT consult     Precautions / Restrictions Precautions Precautions: Fall      Mobility Bed Mobility Overal bed mobility: Needs Assistance Bed Mobility: Supine to Sit     Supine to sit: Max assist     General bed mobility comments: Max due to pt with self limiting behavior stating "I want to lay back down." Pt demonstrating good strength.   Transfers Overall transfer level: Needs assistance Equipment used: Rolling walker (2 wheeled);None Transfers: Sit to/from [facility] Sit to Stand: Min assist;+2 safety/equipment Stand pivot transfers: Min assist;+2 safety/equipment       General transfer comment: Min A for iniation of power up and gaining balance in standing. Min A for faciltiating pivot towards recliner    Balance Overall balance assessment: Needs assistance;History of Falls Sitting-balance support: No upper extremity supported;Feet supported Sitting balance-Leahy Scale: Poor Sitting balance -  Comments: Min A to prevent posterior lean   Standing balance support: Bilateral upper extremity supported;During functional activity Standing balance-Leahy Scale: Poor Standing balance comment: Reliant on UE support                           ADL either performed or assessed with clinical judgement   ADL Overall ADL's : Needs assistance/impaired Eating/Feeding: Set up;Supervision/ safety;Sitting   Grooming: Set up;Supervision/safety;Sitting   Upper Body Bathing: Minimal assistance;Sitting   Lower Body Bathing: Minimal assistance;Sit to/from stand   Upper Body Dressing : Minimal assistance;Sitting   Lower Body Dressing: Minimal assistance;Sit to/from stand   Toilet Transfer: Minimal assistance;+2 for safety/equipment;Stand-pivot;RW(Simulated to recliner)   Toileting- Clothing Manipulation and Hygiene: Sit to/from stand;+2 for safety/equipment;Maximal assistance Toileting - Clothing Manipulation Details (indicate cue type and reason): Min A for standing balance. Requiring Max for toilet hygiene due to confusion and decreased awareness of BM in bed     Functional mobility during ADLs: Minimal assistance;+2 for safety/equipment;Rolling walker General ADL Comments: Pt with incontience in bed. Requiring Max cues to follow commands for toilet hygiene.     Vision         Perception     Praxis      Pertinent Vitals/Pain Pain Assessment: Faces Faces Pain Scale: Hurts even more Pain Location: back Pain Descriptors / Indicators: Aching;Discomfort;Sore Pain Intervention(s): Monitored during session;Repositioned;Limited activity within patient's tolerance     Hand Dominance Right   Extremity/Trunk Assessment Upper Extremity Assessment Upper Extremity Assessment: Generalized weakness   Lower Extremity Assessment Lower Extremity Assessment: Generalized weakness   Cervical / Trunk  Assessment Cervical / Trunk Assessment: Kyphotic   Communication  Communication Communication: No difficulties   Cognition Arousal/Alertness: Awake/alert Behavior During Therapy: WFL for tasks assessed/performed Overall Cognitive Status: History of cognitive impairments - at baseline                                 General Comments: Baseline dementia   General Comments  NT present during session    Exercises     Shoulder Instructions      Home Living Family/patient expects to be discharged to:: Skilled nursing facility                                 Additional Comments: [facility]      Prior Functioning/Environment Level of Independence: Needs assistance        Comments: Poor historian and unable to provide information. No family present.         OT Problem List: Decreased strength;Decreased activity tolerance;Impaired balance (sitting and/or standing);Decreased cognition;Decreased safety awareness;Pain      OT Treatment/Interventions: Self-care/ADL training;DME and/or AE instruction;Cognitive remediation/compensation;Therapeutic activities;Patient/family education;Balance training    OT Goals(Current goals can be found in the care plan section) Acute Rehab OT Goals Patient Stated Goal: none stated OT Goal Formulation: With patient Time For Goal Achievement: 10/24/17 Potential to Achieve Goals: Good ADL Goals Pt Will Perform Grooming: with set-up;with supervision;standing Pt Will Perform Upper Body Dressing: with set-up;with supervision;sitting Pt Will Perform Lower Body Dressing: with set-up;with supervision;sit to/from stand Pt Will Transfer to Toilet: with set-up;with supervision;bedside commode;ambulating Pt Will Perform Toileting - Clothing Manipulation and hygiene: with set-up;with supervision;sit to/from stand  OT Frequency: Min 2X/week   Barriers to D/C:            Co-evaluation PT/OT/SLP Co-Evaluation/Treatment: Yes([place] for transfer safety) Reason for Co-Treatment: To address  functional/ADL transfers   OT goals addressed during session: ADL's and self-care      AM-PAC PT "6 Clicks" Daily Activity     Outcome Measure Help from another person eating meals?: None Help from another person taking care of personal grooming?: A Little Help from another person toileting, which includes using toliet, bedpan, or urinal?: A Little Help from another person bathing (including washing, rinsing, drying)?: A Little Help from another person to put on and taking off regular upper body clothing?: A Little Help from another person to put on and taking off regular lower body clothing?: A Little 6 Click Score: 19   End of Session Equipment Utilized During Treatment: Gait belt;Rolling walker;Oxygen Nurse Communication: Mobility status;Other (comment)(IV off)  Activity Tolerance: Patient limited by pain Patient left: in chair;with call bell/phone within reach;with nursing/sitter in room  OT Visit Diagnosis: Unsteadiness on feet (R26.81);Other abnormalities of gait and mobility (R26.89);Muscle weakness (generalized) (M62.81);Pain Pain - part of body: (Back)                Time: 0037-0488 OT Time Calculation (min): 18 min Charges:  OT General Charges $OT Visit: 1 Visit OT Evaluation $OT Eval Moderate Complexity: [place], OTR/L Acute Rehab Pager: [phone] Office: [place] 10/21/2017, 9:54 AM

## 2017-10-21 NOTE — Progress Notes (Signed)
Progress Note    Monique Rush  GLO:[id number] DOB: 05-05-1940  DOA: 10/20/2017 PCP: Benito Mccreedy, MD    Brief Narrative:    Medical records reviewed and are as summarized below:  77 y.o. female with medical history significant of HTN, dementia  Admitted for acute anemia and likely newly diagnosed breast cancer with multiple metastases   Assessment/Plan:   Active Problems:   Hypokalemia   Abdominal pain   Anemia   Breast mass   Multiple lung nodules  FTT -due to metastatic cancer  Breast mass with possible mets to liver/bone/lung -patient has dementia and lives in SNF-- moving towards long term -recommended to family non-aggressive measures and palliative/hospice -daughter to further discuss with brother and decide plans: aggressive vs non-aggressive  Anemia  -s/p 1 unit PRBC  Hypokalemia  -replete  Pain/compression fractures -most like due to metastatic cancer  Dementia  -has been worsening over last few years  History of hypertension -BB  History of hyperlipidemia -hold statin   Family Communication/Anticipated D/C date and plan/Code Status   DVT prophylaxis: scd- may need lovenox vs heparin in light of hypercoaguability Code Status: Full Code.  Family Communication: daughter at bedside Disposition Plan: pending family conversation   Medical Consultants:    None.    Subjective:   Confused-- rambling  Objective:    Vitals:   10/21/17 0041 10/21/17 0337 10/21/17 0503 10/21/17 0824  BP: 132/69 140/79 (!) 141/81 (!) 145/82  Pulse: (!) 111 (!) 107 (!) 111 (!) 123  Resp: 18 18 14 16   Temp: 97.6 F (36.4 C) 97.9 F (36.6 C) 98.5 F (36.9 C) (!) 97.5 F (36.4 C)  TempSrc: Oral Oral Oral Oral  SpO2:   99% (!) 84%  Weight:      Height:        Intake/Output Summary (Last 24 hours) at 10/21/2017 1258 Last data filed at 10/21/2017 1884 Gross per 24 hour  Intake 2167.82 ml  Output 700 ml  Net 1467.82 ml   Filed Weights   10/20/17 0941 10/20/17 2155  Weight: 52.2 kg 62.8 kg    Exam: In bed, chronically ill/frail appearing Confused- not oriented answers questions inappropriately  On O2 No increased in work of breathing No LE edema Moves all 4 ext   Data Reviewed:   I have personally reviewed following labs and imaging studies:  Labs: Labs show the following:   Basic Metabolic Panel: Recent Labs  Lab 10/20/17 1031 10/20/17 2115 10/21/17 0716  NA 137  --  139  K 3.1*  --  4.4  CL 99  --  103  CO2 24  --  24  GLUCOSE 100*  --  82  BUN 19  --  14  CREATININE 0.83  --  0.79  CALCIUM 9.1  --  9.2  MG  --  2.0 1.8  PHOS  --  3.9 3.7   GFR Estimated Creatinine Clearance: 58.4 mL/min (by C-G formula based on SCr of 0.79 mg/dL). Liver Function Tests: Recent Labs  Lab 10/20/17 1031 10/21/17 0716  AST 58* 74*  ALT 21 23  ALKPHOS 232* 262*  BILITOT 0.7 1.1  PROT 5.8* 6.2*  ALBUMIN 2.2* 2.4*   No results for input(s): LIPASE, AMYLASE in the last 168 hours. No results for input(s): AMMONIA in the last 168 hours. Coagulation profile No results for input(s): INR, PROTIME in the last 168 hours.  CBC: Recent Labs  Lab 10/20/17 1031 10/21/17 0716  WBC 10.5 10.0  HGB 7.2* 9.5*  HCT 24.2* 31.1*  MCV 91.3 90.1  PLT 136* 114*   Cardiac Enzymes: No results for input(s): CKTOTAL, CKMB, CKMBINDEX, TROPONINI in the last 168 hours. BNP (last 3 results) No results for input(s): PROBNP in the last 8760 hours. CBG: Recent Labs  Lab 10/20/17 1057  GLUCAP 94   D-Dimer: No results for input(s): DDIMER in the last 72 hours. Hgb A1c: No results for input(s): HGBA1C in the last 72 hours. Lipid Profile: No results for input(s): CHOL, HDL, LDLCALC, TRIG, CHOLHDL, LDLDIRECT in the last 72 hours. Thyroid function studies: Recent Labs    10/21/17 0716  TSH 2.907   Anemia work up: Recent Labs    10/20/17 2115  VITAMINB12 793  FOLATE 8.9  FERRITIN 406*  TIBC 263  IRON 61  RETICCTPCT  2.6   Sepsis Labs: Recent Labs  Lab 10/20/17 1031 10/21/17 0716  WBC 10.5 10.0    Microbiology Recent Results (from the past 240 hour(s))  Urine Culture     Status: None   Collection Time: 10/20/17 12:52 PM  Result Value Ref Range Status   Specimen Description URINE, RANDOM  Final   Special Requests NONE  Final   Culture   Final    NO GROWTH Performed at [facility], [address] [place], [place] [address]    Report Status 10/21/2017 FINAL  Final  MRSA PCR Screening     Status: None   Collection Time: 10/21/17 12:59 AM  Result Value Ref Range Status   MRSA by PCR NEGATIVE NEGATIVE Final    Comment:        The GeneXpert MRSA Assay (FDA approved for NASAL specimens only), is one component of a comprehensive MRSA colonization surveillance program. It is not intended to diagnose MRSA infection nor to guide or monitor treatment for MRSA infections. Performed at [facility], [place] [address], [place], [place] [address]     Procedures and diagnostic studies:  Ct Abdomen Pelvis Wo Contrast  Result Date: 10/21/2017 CLINICAL DATA:  Hematuria. EXAM: CT ABDOMEN AND PELVIS WITHOUT CONTRAST TECHNIQUE: Multidetector CT imaging of the abdomen and pelvis was performed following the standard protocol without IV contrast. COMPARISON:  CT abdomen and pelvis February 20, 2014 and abdominal radiograph October 20, 2017. FINDINGS: Moderate motion degraded examination. LOWER CHEST: Small pleural effusions and atelectasis. HEPATOBILIARY: Numerous hypodense masses throughout the liver difficult to quantify by noncontrast CT. Status post cholecystectomy. PANCREAS: Atrophic, nonacute. SPLEEN: Normal. ADRENALS/URINARY TRACT: Kidneys are orthotopic, demonstrating normal size and morphology. Contrast excretion limits sensitivity for small nonobstructing nephrolithiasis. Mild RIGHT hydroureteronephrosis and moderately atrophic RIGHT kidney. Delayed RIGHT contrast excretion. Limited assessment  for renal mass by noncontrast CT. Contrast in the urinary bladder. Normal adrenal glands. STOMACH/BOWEL: The stomach, small and large bowel are normal in course and caliber without inflammatory changes. Small and large bowel air-fluid levels. 8.3 cm stool distended rectum with mild perirectal fat stranding. Normal appendix. VASCULAR/LYMPHATIC: Aortoiliac vessels are normal in course and caliber. Severe calcific atherosclerosis. Included chest demonstrates RIGHT axillary lymphadenopathy measuring to 15 mm short axis with pericapsular inflammation. REPRODUCTIVE: Normal. OTHER: No intraperitoneal free fluid or free air. MUSCULOSKELETAL: Destructive 2.6 cm sternal mass better seen on yesterday's CT chest. New moderate L1 and mild L2 fractures with underlying sclerosis and irregularity. Severe T12, moderate T10 and moderate T8 fractures. Abnormal RIGHT breast skin thickening and asymmetric densities. Streak artifact from LEFT hip arthroplasty. IMPRESSION: 1. Motion degraded examination. Hepatic masses consistent with metastatic  disease, limited assessment by noncontrast CT. 2. Moderately atrophic RIGHT kidney with moderate hydroureteronephrosis and delayed function. 3. Multiple osseous metastasis and likely pathologic thoracolumbar fractures. 4. Redemonstration of suspicious RIGHT axillary lymphadenopathy with RIGHT breast skin thickening and potential mass. Recommend mammogram on non emergent basis. Aortic Atherosclerosis (ICD10-I70.0). Electronically Signed   By: Elon Alas M.D.   On: 10/21/2017 05:20   Dg Chest 2 View  Result Date: 10/20/2017 CLINICAL DATA:  Increasing lethargy over the past 2 days. EXAM: CHEST - 2 VIEW COMPARISON:  PA and lateral chest 09/25/2017 and 02/10/2014. FINDINGS: Lung volumes are somewhat low but the lungs are clear. Heart size is normal. No pneumothorax or pleural fluid. Remote compression fracture at the thoracolumbar junction is noted. No acute bony abnormality. IMPRESSION: No  acute disease. Electronically Signed   By: Inge Rise M.D.   On: 10/20/2017 11:14   Dg Abd 1 View  Result Date: 10/20/2017 CLINICAL DATA:  Abdomen pain EXAM: ABDOMEN - 1 VIEW COMPARISON:  09/29/2017 FINDINGS: Normal bowel gas pattern. Left hip hemiarthroplasty. Moderate dextroscoliosis. No acute skeletal abnormality Contrast in the urinary bladder from recent CT chest. Bladder diverticulum on the left. Multiple right renal calculi as noted on prior CT. There is also some contrast in the right renal pelvis which appears mildly dilated. Incomplete evaluation. IMPRESSION: Normal bowel gas pattern Multiple right renal calculi with mild dilatation of the right renal pelvis. Electronically Signed   By: Franchot Gallo M.D.   On: 10/20/2017 21:31   Ct Angio Chest Pe W And/or Wo Contrast  Result Date: 10/20/2017 CLINICAL DATA:  Hypoxemia. Elevated D-dimer. BILATERAL UPPER and LOWER extremity edema. EXAM: CT ANGIOGRAPHY CHEST WITH CONTRAST TECHNIQUE: Multidetector CT imaging of the chest was performed using the standard protocol during bolus administration of intravenous contrast. Multiplanar CT image reconstructions and MIPs were obtained to evaluate the vascular anatomy. CONTRAST:  90mL [id number] IOPAMIDOL INJECTION 76% IV. COMPARISON:  None. FINDINGS: Beam hardening streak artifact is present as the patient was unable to raise the LEFT arm. Cardiovascular: Contrast opacification of pulmonary arteries is very good. No filling defects within either main pulmonary artery or their segmental branches in either lung to suggest pulmonary embolism. Heart mildly enlarged. LEFT ventricular hypertrophy. Moderate to severe LAD and RIGHT coronary artery atherosclerosis. No pericardial effusion. Moderate to severe atherosclerosis involving the thoracic and proximal abdominal aorta with calcified and noncalcified plaque. No evidence of aortic aneurysm. Mediastinum/Nodes: 2 adjacent enlarged RIGHT axillary lymph nodes  measuring approximately 2.7 x 1.4 cm (series 5, image 73) and 2.3 x 1.5 cm (image 54). No pathologic LEFT axillary, mediastinal or hilar lymphadenopathy. Moderate-sized hiatal hernia. Normal appearing esophagus. Approximate 1.2 cm LEFT lobe thyroid nodule. Lungs/Pleura: Numerous BILATERAL lung nodules. Focal bronchiectasis involving the LEFT UPPER LOBE and the SUPERIOR segment LEFT LOWER LOBE. Emphysematous changes in both lungs. No confluent airspace consolidation. Small BILATERAL pleural effusions and associated mild passive atelectasis in the lower lobes. Central airways patent with marked bronchial wall thickening. Upper Abdomen: Contrast is present in the visualized renal collecting systems likely related to an earlier test injection. Visualized upper abdomen unremarkable for the early arterial phase of enhancement. Musculoskeletal: Thoracolumbar levoscoliosis. Multiple compression fractures which are not likely acute, including T8, T10, T12, and L1. Sclerosis is present in each of these vertebral bodies. Mixed lytic and sclerotic lesions are present in the sternum. Other: I am suspicious that there is an incompletely imaged mass in the RIGHT breast (series 5, images 122-125). Review of the MIP  images confirms the above findings. IMPRESSION: 1. No evidence of pulmonary embolism. 2. RIGHT axillary lymphadenopathy.  Index nodes are measured above. 3. Numerous BILATERAL lung nodules indicating metastatic disease. 4. I am suspicious that there is an incompletely imaged mass in the RIGHT breast which would account for the RIGHT axillary lymphadenopathy. Diagnostic mammography and possible ultrasound is recommended in further evaluation. 5. Small BILATERAL pleural effusions and associated mild passive atelectasis in the lower lobes. 6. Compression fractures involving T8, T10, T12 and L1. As there is sclerosis in each of these vertebral bodies, these may be pathologic and related to metastatic disease. Mixed lytic and  sclerotic metastases are present in the sternum. 7. Moderate-sized hiatal hernia. Aortic Atherosclerosis (ICD10-I70.0) and Emphysema (ICD10-J43.9). Electronically Signed   By: Evangeline Dakin M.D.   On: 10/20/2017 17:15    Medications:   . amLODipine  10 mg Oral Daily  . donepezil  5 mg Oral QHS  . iopamidol      . metoprolol succinate  50 mg Oral Daily  . mirtazapine  30 mg Oral QHS  . pravastatin  20 mg Oral Daily  . QUEtiapine  100 mg Oral QHS  . senna  1 tablet Oral BID  . sertraline  25 mg Oral Daily   Continuous Infusions:   LOS: 0 days   Geradine Girt  [facility]   *Please refer to [email or web address], password [email or web address] to get updated schedule on who will round on this patient, as hospitalists switch teams weekly. If 7PM-7AM, please contact night-coverage at [email or web address], password [email or web address] for any overnight needs.  10/21/2017, 12:58 PM

## 2017-10-22 DIAGNOSIS — C50911 Malignant neoplasm of unspecified site of right female breast: Secondary | ICD-10-CM | POA: Diagnosis present

## 2017-10-22 DIAGNOSIS — C50919 Malignant neoplasm of unspecified site of unspecified female breast: Secondary | ICD-10-CM | POA: Diagnosis not present

## 2017-10-22 DIAGNOSIS — Z820 Family history of epilepsy and other diseases of the nervous system: Secondary | ICD-10-CM | POA: Diagnosis not present

## 2017-10-22 DIAGNOSIS — D649 Anemia, unspecified: Secondary | ICD-10-CM | POA: Diagnosis not present

## 2017-10-22 DIAGNOSIS — L89151 Pressure ulcer of sacral region, stage 1: Secondary | ICD-10-CM | POA: Diagnosis present

## 2017-10-22 DIAGNOSIS — C7801 Secondary malignant neoplasm of right lung: Secondary | ICD-10-CM | POA: Diagnosis present

## 2017-10-22 DIAGNOSIS — M8458XA Pathological fracture in neoplastic disease, other specified site, initial encounter for fracture: Secondary | ICD-10-CM | POA: Diagnosis present

## 2017-10-22 DIAGNOSIS — Z515 Encounter for palliative care: Secondary | ICD-10-CM | POA: Diagnosis not present

## 2017-10-22 DIAGNOSIS — F0390 Unspecified dementia without behavioral disturbance: Secondary | ICD-10-CM | POA: Diagnosis present

## 2017-10-22 DIAGNOSIS — E876 Hypokalemia: Secondary | ICD-10-CM | POA: Diagnosis present

## 2017-10-22 DIAGNOSIS — E785 Hyperlipidemia, unspecified: Secondary | ICD-10-CM | POA: Diagnosis present

## 2017-10-22 DIAGNOSIS — N390 Urinary tract infection, site not specified: Secondary | ICD-10-CM | POA: Diagnosis present

## 2017-10-22 DIAGNOSIS — L8990 Pressure ulcer of unspecified site, unspecified stage: Secondary | ICD-10-CM

## 2017-10-22 DIAGNOSIS — R531 Weakness: Secondary | ICD-10-CM | POA: Diagnosis not present

## 2017-10-22 DIAGNOSIS — Z8249 Family history of ischemic heart disease and other diseases of the circulatory system: Secondary | ICD-10-CM | POA: Diagnosis not present

## 2017-10-22 DIAGNOSIS — S22000A Wedge compression fracture of unspecified thoracic vertebra, initial encounter for closed fracture: Secondary | ICD-10-CM | POA: Diagnosis not present

## 2017-10-22 DIAGNOSIS — N63 Unspecified lump in unspecified breast: Secondary | ICD-10-CM | POA: Diagnosis not present

## 2017-10-22 DIAGNOSIS — Z66 Do not resuscitate: Secondary | ICD-10-CM

## 2017-10-22 DIAGNOSIS — I10 Essential (primary) hypertension: Secondary | ICD-10-CM | POA: Diagnosis present

## 2017-10-22 DIAGNOSIS — F0150 Vascular dementia without behavioral disturbance: Secondary | ICD-10-CM | POA: Diagnosis not present

## 2017-10-22 DIAGNOSIS — Z681 Body mass index (BMI) 19 or less, adult: Secondary | ICD-10-CM | POA: Diagnosis not present

## 2017-10-22 DIAGNOSIS — C7802 Secondary malignant neoplasm of left lung: Secondary | ICD-10-CM | POA: Diagnosis present

## 2017-10-22 DIAGNOSIS — R918 Other nonspecific abnormal finding of lung field: Secondary | ICD-10-CM | POA: Diagnosis not present

## 2017-10-22 DIAGNOSIS — Z96642 Presence of left artificial hip joint: Secondary | ICD-10-CM | POA: Diagnosis present

## 2017-10-22 DIAGNOSIS — C7951 Secondary malignant neoplasm of bone: Secondary | ICD-10-CM | POA: Diagnosis present

## 2017-10-22 DIAGNOSIS — R627 Adult failure to thrive: Secondary | ICD-10-CM | POA: Diagnosis present

## 2017-10-22 DIAGNOSIS — C787 Secondary malignant neoplasm of liver and intrahepatic bile duct: Secondary | ICD-10-CM | POA: Diagnosis present

## 2017-10-22 LAB — TYPE AND SCREEN
ABO/RH(D): O POS
ANTIBODY SCREEN: NEGATIVE
UNIT DIVISION: 0

## 2017-10-22 LAB — COMPREHENSIVE METABOLIC PANEL
ALBUMIN: 2 g/dL — AB (ref 3.5–5.0)
ALK PHOS: 213 U/L — AB (ref 38–126)
ALT: 19 U/L (ref 0–44)
ANION GAP: 8 (ref 5–15)
AST: 57 U/L — ABNORMAL HIGH (ref 15–41)
BUN: 18 mg/dL (ref 8–23)
CHLORIDE: 107 mmol/L (ref 98–111)
CO2: 24 mmol/L (ref 22–32)
Calcium: 9.2 mg/dL (ref 8.9–10.3)
Creatinine, Ser: 0.88 mg/dL (ref 0.44–1.00)
Glucose, Bld: 87 mg/dL (ref 70–99)
POTASSIUM: 3.9 mmol/L (ref 3.5–5.1)
SODIUM: 139 mmol/L (ref 135–145)
Total Bilirubin: 0.6 mg/dL (ref 0.3–1.2)
Total Protein: 5.3 g/dL — ABNORMAL LOW (ref 6.5–8.1)

## 2017-10-22 LAB — CBC
HCT: 27.3 % — ABNORMAL LOW (ref 36.0–46.0)
HEMOGLOBIN: 8.5 g/dL — AB (ref 12.0–15.0)
MCH: 28 pg (ref 26.0–34.0)
MCHC: 31.1 g/dL (ref 30.0–36.0)
MCV: 89.8 fL (ref 78.0–100.0)
PLATELETS: 90 10*3/uL — AB (ref 150–400)
RBC: 3.04 MIL/uL — AB (ref 3.87–5.11)
RDW: 17.2 % — ABNORMAL HIGH (ref 11.5–15.5)
WBC: 8.2 10*3/uL (ref 4.0–10.5)

## 2017-10-22 LAB — BPAM RBC
Blood Product Expiration Date: 201909272359
ISSUE DATE / TIME: 201909010020
UNIT TYPE AND RH: 5100

## 2017-10-22 NOTE — Progress Notes (Signed)
Progress Note    Monique Rush  KPT:[id number] DOB: 04-Jul-1940  DOA: 10/20/2017 PCP: Benito Mccreedy, MD    Brief Narrative:    Medical records reviewed and are as summarized below:  77 y.o. female with medical history significant of HTN, dementia  Admitted for acute anemia and likely newly diagnosed breast cancer with multiple metastases.  Plan is to transition to    Assessment/Plan:   Active Problems:   Hypokalemia   Abdominal pain   Anemia   Breast mass   Multiple lung nodules   Pressure injury of skin  FTT -most likely due to metastatic cancer plus dementia  Suspected Breast mass with possible mets to liver/bone/lung -patient has dementia and is in SNF-- moving towards long term care -palliative care met with family- plan to transition towards comfort -suspect will have a rapid decline  Anemia  -s/p 1 unit PRBC  Hypokalemia  -replete  Pain/compression fractures -most like due to metastatic cancer -PRN pain medications  Dementia  -has been worsening over last few years  History of hypertension -BB  History of hyperlipidemia -hold statin   Family Communication/Anticipated D/C date and plan/Code Status   DVT prophylaxis: scd- may need lovenox vs heparin in light of hypercoaguability but now with Plts Code Status: DNR Family Communication: daughter at bedside Disposition Plan: pending family conversation   Medical Consultants:    Palliative care.    Subjective:   Awake but still confused  Objective:    Vitals:   10/21/17 1800 10/21/17 2107 10/22/17 0557 10/22/17 1037  BP: 116/70 119/88 113/71 126/70  Pulse: 98 (!) 108 96 (!) 102  Resp: '16 20 14 18  ' Temp: 98.6 F (37 C) 98.2 F (36.8 C) (!) 97.5 F (36.4 C) 97.8 F (36.6 C)  TempSrc: Oral Oral Oral Oral  SpO2: 93% 93% 94% 94%  Weight:      Height:        Intake/Output Summary (Last 24 hours) at 10/22/2017 1406 Last data filed at 10/22/2017 1345 Gross per 24 hour    Intake 550 ml  Output 0 ml  Net 550 ml   Filed Weights   10/20/17 0941 10/20/17 2155  Weight: 52.2 kg 62.8 kg    Exam: In bed, NAD No increased work of breathing Chronically ill appearing   Data Reviewed:   I have personally reviewed following labs and imaging studies:  Labs: Labs show the following:   Basic Metabolic Panel: Recent Labs  Lab 10/20/17 1031 10/20/17 2115 10/21/17 0716 10/22/17 0359  NA 137  --  139 139  K 3.1*  --  4.4 3.9  CL 99  --  103 107  CO2 24  --  24 24  GLUCOSE 100*  --  82 87  BUN 19  --  14 18  CREATININE 0.83  --  0.79 0.88  CALCIUM 9.1  --  9.2 9.2  MG  --  2.0 1.8  --   PHOS  --  3.9 3.7  --    GFR Estimated Creatinine Clearance: 53.1 mL/min (by C-G formula based on SCr of 0.88 mg/dL). Liver Function Tests: Recent Labs  Lab 10/20/17 1031 10/21/17 0716 10/22/17 0359  AST 58* 74* 57*  ALT '21 23 19  ' ALKPHOS 232* 262* 213*  BILITOT 0.7 1.1 0.6  PROT 5.8* 6.2* 5.3*  ALBUMIN 2.2* 2.4* 2.0*   No results for input(s): LIPASE, AMYLASE in the last 168 hours. No results for input(s): AMMONIA in the last 168  hours. Coagulation profile No results for input(s): INR, PROTIME in the last 168 hours.  CBC: Recent Labs  Lab 10/20/17 1031 10/21/17 0716 10/22/17 0359  WBC 10.5 10.0 8.2  HGB 7.2* 9.5* 8.5*  HCT 24.2* 31.1* 27.3*  MCV 91.3 90.1 89.8  PLT 136* 114* 90*   Cardiac Enzymes: No results for input(s): CKTOTAL, CKMB, CKMBINDEX, TROPONINI in the last 168 hours. BNP (last 3 results) No results for input(s): PROBNP in the last 8760 hours. CBG: Recent Labs  Lab 10/20/17 1057  GLUCAP 94   D-Dimer: No results for input(s): DDIMER in the last 72 hours. Hgb A1c: No results for input(s): HGBA1C in the last 72 hours. Lipid Profile: No results for input(s): CHOL, HDL, LDLCALC, TRIG, CHOLHDL, LDLDIRECT in the last 72 hours. Thyroid function studies: Recent Labs    10/21/17 0716  TSH 2.907   Anemia work up: Recent Labs     10/20/17 2115  VITAMINB12 793  FOLATE 8.9  FERRITIN 406*  TIBC 263  IRON 61  RETICCTPCT 2.6   Sepsis Labs: Recent Labs  Lab 10/20/17 1031 10/21/17 0716 10/22/17 0359  WBC 10.5 10.0 8.2    Microbiology Recent Results (from the past 240 hour(s))  Urine Culture     Status: None   Collection Time: 10/20/17 12:52 PM  Result Value Ref Range Status   Specimen Description URINE, RANDOM  Final   Special Requests NONE  Final   Culture   Final    NO GROWTH Performed at [facility], [address] [place], [place] [address]    Report Status 10/21/2017 FINAL  Final  MRSA PCR Screening     Status: None   Collection Time: 10/21/17 12:59 AM  Result Value Ref Range Status   MRSA by PCR NEGATIVE NEGATIVE Final    Comment:        The GeneXpert MRSA Assay (FDA approved for NASAL specimens only), is one component of a comprehensive MRSA colonization surveillance program. It is not intended to diagnose MRSA infection nor to guide or monitor treatment for MRSA infections. Performed at [facility], [place] [address], [place], [place] [address]     Procedures and diagnostic studies:  Ct Abdomen Pelvis Wo Contrast  Result Date: 10/21/2017 CLINICAL DATA:  Hematuria. EXAM: CT ABDOMEN AND PELVIS WITHOUT CONTRAST TECHNIQUE: Multidetector CT imaging of the abdomen and pelvis was performed following the standard protocol without IV contrast. COMPARISON:  CT abdomen and pelvis February 20, 2014 and abdominal radiograph October 20, 2017. FINDINGS: Moderate motion degraded examination. LOWER CHEST: Small pleural effusions and atelectasis. HEPATOBILIARY: Numerous hypodense masses throughout the liver difficult to quantify by noncontrast CT. Status post cholecystectomy. PANCREAS: Atrophic, nonacute. SPLEEN: Normal. ADRENALS/URINARY TRACT: Kidneys are orthotopic, demonstrating normal size and morphology. Contrast excretion limits sensitivity for small nonobstructing nephrolithiasis. Mild  RIGHT hydroureteronephrosis and moderately atrophic RIGHT kidney. Delayed RIGHT contrast excretion. Limited assessment for renal mass by noncontrast CT. Contrast in the urinary bladder. Normal adrenal glands. STOMACH/BOWEL: The stomach, small and large bowel are normal in course and caliber without inflammatory changes. Small and large bowel air-fluid levels. 8.3 cm stool distended rectum with mild perirectal fat stranding. Normal appendix. VASCULAR/LYMPHATIC: Aortoiliac vessels are normal in course and caliber. Severe calcific atherosclerosis. Included chest demonstrates RIGHT axillary lymphadenopathy measuring to 15 mm short axis with pericapsular inflammation. REPRODUCTIVE: Normal. OTHER: No intraperitoneal free fluid or free air. MUSCULOSKELETAL: Destructive 2.6 cm sternal mass better seen on yesterday's CT chest. New moderate L1 and mild  L2 fractures with underlying sclerosis and irregularity. Severe T12, moderate T10 and moderate T8 fractures. Abnormal RIGHT breast skin thickening and asymmetric densities. Streak artifact from LEFT hip arthroplasty. IMPRESSION: 1. Motion degraded examination. Hepatic masses consistent with metastatic disease, limited assessment by noncontrast CT. 2. Moderately atrophic RIGHT kidney with moderate hydroureteronephrosis and delayed function. 3. Multiple osseous metastasis and likely pathologic thoracolumbar fractures. 4. Redemonstration of suspicious RIGHT axillary lymphadenopathy with RIGHT breast skin thickening and potential mass. Recommend mammogram on non emergent basis. Aortic Atherosclerosis (ICD10-I70.0). Electronically Signed   By: Elon Alas M.D.   On: 10/21/2017 05:20   Dg Abd 1 View  Result Date: 10/20/2017 CLINICAL DATA:  Abdomen pain EXAM: ABDOMEN - 1 VIEW COMPARISON:  09/29/2017 FINDINGS: Normal bowel gas pattern. Left hip hemiarthroplasty. Moderate dextroscoliosis. No acute skeletal abnormality Contrast in the urinary bladder from recent CT chest.  Bladder diverticulum on the left. Multiple right renal calculi as noted on prior CT. There is also some contrast in the right renal pelvis which appears mildly dilated. Incomplete evaluation. IMPRESSION: Normal bowel gas pattern Multiple right renal calculi with mild dilatation of the right renal pelvis. Electronically Signed   By: Franchot Gallo M.D.   On: 10/20/2017 21:31   Ct Angio Chest Pe W And/or Wo Contrast  Result Date: 10/20/2017 CLINICAL DATA:  Hypoxemia. Elevated D-dimer. BILATERAL UPPER and LOWER extremity edema. EXAM: CT ANGIOGRAPHY CHEST WITH CONTRAST TECHNIQUE: Multidetector CT imaging of the chest was performed using the standard protocol during bolus administration of intravenous contrast. Multiplanar CT image reconstructions and MIPs were obtained to evaluate the vascular anatomy. CONTRAST:  65m ISOVUE-370 IOPAMIDOL INJECTION 76% IV. COMPARISON:  None. FINDINGS: Beam hardening streak artifact is present as the patient was unable to raise the LEFT arm. Cardiovascular: Contrast opacification of pulmonary arteries is very good. No filling defects within either main pulmonary artery or their segmental branches in either lung to suggest pulmonary embolism. Heart mildly enlarged. LEFT ventricular hypertrophy. Moderate to severe LAD and RIGHT coronary artery atherosclerosis. No pericardial effusion. Moderate to severe atherosclerosis involving the thoracic and proximal abdominal aorta with calcified and noncalcified plaque. No evidence of aortic aneurysm. Mediastinum/Nodes: 2 adjacent enlarged RIGHT axillary lymph nodes measuring approximately 2.7 x 1.4 cm (series 5, image 73) and 2.3 x 1.5 cm (image 54). No pathologic LEFT axillary, mediastinal or hilar lymphadenopathy. Moderate-sized hiatal hernia. Normal appearing esophagus. Approximate 1.2 cm LEFT lobe thyroid nodule. Lungs/Pleura: Numerous BILATERAL lung nodules. Focal bronchiectasis involving the LEFT UPPER LOBE and the SUPERIOR segment LEFT  LOWER LOBE. Emphysematous changes in both lungs. No confluent airspace consolidation. Small BILATERAL pleural effusions and associated mild passive atelectasis in the lower lobes. Central airways patent with marked bronchial wall thickening. Upper Abdomen: Contrast is present in the visualized renal collecting systems likely related to an earlier test injection. Visualized upper abdomen unremarkable for the early arterial phase of enhancement. Musculoskeletal: Thoracolumbar levoscoliosis. Multiple compression fractures which are not likely acute, including T8, T10, T12, and L1. Sclerosis is present in each of these vertebral bodies. Mixed lytic and sclerotic lesions are present in the sternum. Other: I am suspicious that there is an incompletely imaged mass in the RIGHT breast (series 5, images 122-125). Review of the MIP images confirms the above findings. IMPRESSION: 1. No evidence of pulmonary embolism. 2. RIGHT axillary lymphadenopathy.  Index nodes are measured above. 3. Numerous BILATERAL lung nodules indicating metastatic disease. 4. I am suspicious that there is an incompletely imaged mass in the RIGHT breast which  would account for the RIGHT axillary lymphadenopathy. Diagnostic mammography and possible ultrasound is recommended in further evaluation. 5. Small BILATERAL pleural effusions and associated mild passive atelectasis in the lower lobes. 6. Compression fractures involving T8, T10, T12 and L1. As there is sclerosis in each of these vertebral bodies, these may be pathologic and related to metastatic disease. Mixed lytic and sclerotic metastases are present in the sternum. 7. Moderate-sized hiatal hernia. Aortic Atherosclerosis (ICD10-I70.0) and Emphysema (ICD10-J43.9). Electronically Signed   By: Evangeline Dakin M.D.   On: 10/20/2017 17:15    Medications:   . amLODipine  10 mg Oral Daily  . donepezil  5 mg Oral QHS  . metoprolol succinate  50 mg Oral Daily  . mirtazapine  30 mg Oral QHS  .  QUEtiapine  100 mg Oral QHS  . senna  1 tablet Oral BID  . sertraline  25 mg Oral Daily   Continuous Infusions:   LOS: 0 days   Geradine Girt  [facility]   *Please refer to [email or web address], password [email or web address] to get updated schedule on who will round on this patient, as hospitalists switch teams weekly. If 7PM-7AM, please contact night-coverage at [email or web address], password [email or web address] for any overnight needs.  10/22/2017, 2:06 PM

## 2017-10-22 NOTE — NC FL2 (Signed)
[place] LEVEL OF CARE SCREENING TOOL     IDENTIFICATION  Patient Name: Monique Rush Birthdate: [birth year] Sex: female Admission Date (Current Location): 10/20/2017  [facility] and [place] Number:  Herbalist and Address:  [facility], [place] [address] [place], [place] [address]      Provider Number: [id number]  Attending Physician Name and Address:  Geradine Girt, DO  Relative Name and Phone Number:  Lanyiah, Brix, [phone];  Floriene, Jeschke - Daughter, [phone]     Current Level of Care: Hospital Recommended Level of Care: Skilled Nursing Facility(From [facility]) Prior Approval Number:    Date Approved/Denied:   PASRR Number: [id number](Eff. 02/12/14)  Discharge Plan: SNF([facility])    Current Diagnoses: Patient Active Problem List   Diagnosis Date Noted  . Hypokalemia 10/20/2017  . Abdominal pain 10/20/2017  . Anemia 10/20/2017  . Breast mass 10/20/2017  . Multiple lung nodules 10/20/2017  . Compression fracture of body of thoracic vertebra ([place]) 10/01/2017  . Delirium 10/01/2017  . Acute metabolic encephalopathy 12/06/5100  . Urinary tract infection without hematuria   . Fracture of femoral neck, left ([place]) 02/12/2014  . PRES (posterior reversible encephalopathy syndrome) 02/11/2014  . Encephalopathy 02/11/2014  . Leukocytosis 02/11/2014  . Dehydration 02/11/2014    Orientation RESPIRATION BLADDER Height & Weight     Self  O2(2 Liters Oxygen) External catheter(Placed 10/20/17) Weight: 138 lb 7.2 oz (62.8 kg) Height:  5\' 9"  (175.3 cm)  BEHAVIORAL SYMPTOMS/MOOD NEUROLOGICAL BOWEL NUTRITION STATUS      Incontinent Diet(Soft diet)  AMBULATORY STATUS COMMUNICATION OF NEEDS Skin   Total Care(Pstient was unable to ambulate with PT during eval on 9/1) Verbally Normal                       Personal Care Assistance Level of Assistance  Bathing, Feeding, Dressing Bathing  Assistance: Limited assistance Feeding assistance: Independent(Assistance with set-up) Dressing Assistance: Limited assistance     Functional Limitations Info  Sight, Hearing, Speech Sight Info: Adequate Hearing Info: Adequate Speech Info: Adequate    SPECIAL CARE FACTORS FREQUENCY  PT (By licensed PT), OT (By licensed OT)     PT Frequency: Evaluated 9/1 and a minimum of 2X per week therapy recommended during acute ipatient stay OT Frequency: Evaluated 9/1 and a minimum of 2X per week therapy recommended during acute inpatient stay            Contractures Contractures Info: Not present    Additional Factors Info  Code Status, Allergies Code Status Info: Full Allergies Info: Morphine and Related; Oxycontin; Percocet           Current Medications (10/22/2017):  This is the current hospital active medication list Current Facility-Administered Medications  Medication Dose Route Frequency Provider Last Rate Last Dose  . acetaminophen (TYLENOL) tablet 650 mg  650 mg Oral Q6H PRN Toy Baker, MD   650 mg at 10/21/17 2236   Or  . acetaminophen (TYLENOL) suppository 650 mg  650 mg Rectal Q6H PRN Doutova, Anastassia, MD      . amLODipine (NORVASC) tablet 10 mg  10 mg Oral Daily Doutova, Anastassia, MD   10 mg at 10/22/17 1028  . bisacodyl (DULCOLAX) suppository 10 mg  10 mg Rectal Daily PRN Doutova, Nyoka Lint, MD      . donepezil (ARICEPT) tablet 5 mg  5 mg Oral QHS Doutova, Anastassia, MD   5 mg at 10/21/17 2236  .  methocarbamol (ROBAXIN) tablet 500 mg  500 mg Oral Q8H PRN Toy Baker, MD   500 mg at 10/21/17 2237  . metoprolol succinate (TOPROL-XL) 24 hr tablet 50 mg  50 mg Oral Daily Doutova, Anastassia, MD   50 mg at 10/22/17 1028  . mirtazapine (REMERON) tablet 30 mg  30 mg Oral QHS Toy Baker, MD   30 mg at 10/21/17 2236  . ondansetron (ZOFRAN) tablet 4 mg  4 mg Oral Q6H PRN Doutova, Anastassia, MD       Or  . ondansetron (ZOFRAN) injection 4 mg  4  mg Intravenous Q6H PRN Doutova, Anastassia, MD   4 mg at 10/21/17 1216  . oxyCODONE (Oxy IR/ROXICODONE) immediate release tablet 2.5 mg  2.5 mg Oral Q6H PRN Eulogio Bear U, DO   2.5 mg at 10/21/17 1351  . polyethylene glycol (MIRALAX / GLYCOLAX) packet 17 g  17 g Oral Daily PRN Doutova, Anastassia, MD      . QUEtiapine (SEROQUEL) tablet 100 mg  100 mg Oral QHS Doutova, Anastassia, MD   100 mg at 10/21/17 2237  . senna (SENOKOT) tablet 8.6 mg  1 tablet Oral BID Toy Baker, MD   8.6 mg at 10/21/17 2236  . sertraline (ZOLOFT) tablet 25 mg  25 mg Oral Daily Toy Baker, MD   25 mg at 10/22/17 1028     Discharge Medications: Please see discharge summary for a list of discharge medications.  Relevant Imaging Results:  Relevant Lab Results:   Additional Information ss#[id number]  Sable Feil, LCSW

## 2017-10-22 NOTE — Clinical Social Work Note (Signed)
Clinical Social Work Assessment  Patient Details  Name: Monique Rush MRN: [id number] Date of Birth: 11-29-1940  Date of referral:  10/22/17               Reason for consult:  Facility Placement, Discharge Planning                Permission sought to share information with:  Family Supports Permission granted to share information::  No(Patient oriented to self only)  Name::        Agency::     Relationship::     Contact Information:     Housing/Transportation Living arrangements for the past 2 months:  Skilled Nursing Facility(From [facility]) Source of Information:  Adult Children(Son-Monique Rush and Daughter Monique Rush) Patient Interpreter Needed:  None Criminal Activity/Legal Involvement Pertinent to Current Situation/Hospitalization:  No - Comment as needed Significant Relationships:  Adult Children Lives with:  Facility Resident Do you feel safe going back to the place where you live?  Yes(Adult children really like [facility]), however they have no MA LTC beds) Need for family participation in patient care:  Yes (Comment)  Care giving concerns:  CSW talked with patient's daughter Monique Rush [phone]) regarding discharge planning for patient, as Monique Rush oriented to self only.   Social Worker assessment / plan: CSW talked with daughter by phone regarding discharge plan for patient. Daughter advised CSW that [facility] is the preferred facility as her older sister is there for rehab due to a fall and hip fracture. Monique Rush reported that her prior to hospitalization, her mom lived at home and cared for her daughter (Monique Rush's sister) who has requires total care from infancy. Per Monique Rush, her brother lives behind her mom and helps our with yard work and is looking after their sister since their mom is in the hospital. Daughter provided history of what brought patient to hospital: she had a change in mentation and was taken to her doctor as Monique Rush  thought she had a UTI. Patient was then brought to hospital and admitted.   Daughter in agreement with short-term rehab and went to [facility] today to talk with the admissions director regarding her mother. CSW explained facility search process and informed her that patient's information will be sent out to [facility] and all the other facilities in [facility] and she expressed understanding and agreement.  Employment status:  Retired Brewing technologist) PT Recommendations:  [place] / Referral to community resources:  Other (Comment Required)(Talked with children regarding continued SNF need)  Patient/Family's Response to care:  No concerns expressed by daughter regarding patient's care during hospitalization.  Patient/Family's Understanding of and Emotional Response to Diagnosis, Current Treatment, and Prognosis:  Daughter expressed understanding and agreement regarding her mother's need for ST rehab.  Emotional Assessment Appearance:  Appears stated age Attitude/Demeanor/Rapport:  Unable to Assess(Greeted patient, but talked with adult children in another location) Affect (typically observed):  Unable to Assess Orientation:  Oriented to Self Alcohol / Substance use:  Never Used Psych involvement (Current and /or in the community):  No (Comment)  Discharge Needs  Concerns to be addressed:  Discharge Planning Concerns Readmission within the last 30 days:  Yes Current discharge risk:  None Barriers to Discharge:  Continued Medical Work up, [place], [place], [place] 10/22/2017, 2:21 PM

## 2017-10-22 NOTE — Consult Note (Signed)
Consultation Note Date: 10/22/2017   Patient Name: Monique Rush  DOB: 1940-04-14  MRN: [id number]  Age / Sex: 77 y.o., female  PCP: Benito Mccreedy, MD Referring Physician: Geradine Girt, DO  Reason for Consultation: Establishing goals of care and Psychosocial/spiritual support  HPI/Patient Profile: 77 y.o. 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  Admitted with  acute encephalopathy from her SNF and thought to have UTI.      Per family patient has had continued physical, functional and cognitive decline over the past month.  Poor PO intake and significant weight loss.    CT ABD IMPRESSION: 10-21-17  1. Motion degraded examination. Hepatic masses consistent with metastatic disease, limited assessment by noncontrast CT. 2. Moderately atrophic RIGHT kidney with moderate hydroureteronephrosis and delayed function. 3. Multiple osseous metastasis and likely pathologic thoracolumbar fractures. 4. Redemonstration of suspicious RIGHT axillary lymphadenopathy with RIGHT breast skin thickening and potential mass. Recommend mammogram on non emergent basis.  Family face treatment option decisions, advanced directive decisions and anticipatory care needs.   Clinical Assessment and Goals of Care:   This NP Wadie Lessen reviewed medical records, received report from team, assessed the patient and then meet at the patient's bedside along with her son and daughter  to discuss diagnosis, prognosis, GOC, EOL wishes disposition and options.  Both son and daughter verbalize clear understanding regarding newly diagnosed breast cancer with multiple metastatic sites.  They speak to their  frustration with the fact that patient was hospitalized less than a month ago, and they believe no significant work-up was completed at that time to uncover this now  diagnosed breast cancer.  At this time they beleive that a comfort approach is in the patient's best interest  Concept of Hospice and palliative care was discussed and detailed  A detailed discussion was had today regarding advanced directives.  Concepts specific to code status, artifical feeding and hydration, continued IV antibiotics and rehospitalization was had.  The difference between a aggressive medical intervention path  and a palliative comfort care path for this patient at this time was had.  Values and goals of care important to patient and family were attempted to be elicited.  MOST form introduced and will be completed prior to discharge   Natural trajectory and expectations at EOL were discussed.  Questions and concerns addressed. A Hard Choices booklet was given for review.    Family encouraged to call with questions or concerns.    PMT will continue to support holistically.   NEXT OF KIN-no documented healthcare power of attorney or advanced directives in place.  Both son and daughter worked together for the best interest of the patient.  Patient does not have capacity to make her own decisions.    SUMMARY OF RECOMMENDATIONS    Code Status/Advance Care Planning:  DNR-commended today   Symptom Management:   Pain: Oxycodone IR 2.5 mg p.o. every 6 hours as needed for pain  Palliative Prophylaxis:   Aspiration,  Bowel Regimen, Delirium Protocol and Frequent Pain Assessment  Additional Recommendations (Limitations, Scope, Preferences):  Avoid Hospitalization, Minimize Medications, No Artificial Feeding, No Blood Transfusions, No Chemotherapy, No Diagnostics, No IV Fluids and No Lab Draws  Psycho-social/Spiritual:   Desire for further Chaplaincy support:no  Additional Recommendations: Education on Hospice  Prognosis:   Unable to determine  Discharge Planning:  Focus of care is comfort and dignity.  Family hope that patient can  return to [facility] with  hospice services in place for EOL care, prognosis is likely less than 3 months..  Family is working with social work at the facility to [employer] benefit as soon as possible.  Will discuss with hospital social work to help family with transition of care options.    To Be Determined      Primary Diagnoses: Present on Admission: . Hypokalemia . Abdominal pain . Anemia . Breast mass . Multiple lung nodules   I have reviewed the medical record, interviewed the patient and family, and examined the patient. The following aspects are pertinent.  Past Medical History:  Diagnosis Date  . Encephalopathy   . Hypertension   . Insomnia   . Urine retention    Social History   Socioeconomic History  . Marital status: Married    Spouse name: Not on file  . Number of children: 2  . Years of education: 74  . Highest education level: Not on file  Occupational History  . Occupation: Retired  Scientific laboratory technician  . Financial resource strain: Not on file  . Food insecurity:    Worry: Not on file    Inability: Not on file  . Transportation needs:    Medical: Not on file    Non-medical: Not on file  Tobacco Use  . Smoking status: Never Smoker  . Smokeless tobacco: Never Used  Substance and Sexual Activity  . Alcohol use: No    Alcohol/week: 0.0 standard drinks  . Drug use: No  . Sexual activity: Never  Lifestyle  . Physical activity:    Days per week: Not on file    Minutes per session: Not on file  . Stress: Not on file  Relationships  . Social connections:    Talks on phone: Not on file    Gets together: Not on file    Attends religious service: Not on file    Active member of club or organization: Not on file    Attends meetings of clubs or organizations: Not on file    Relationship status: Not on file  Other Topics Concern  . Not on file  Social History Narrative   Lives at home alone.  Son lives two blocks away.   Right-handed.   No caffeine use.   Family History    Problem Relation Age of Onset  . Alzheimer's disease Mother   . Heart disease Father    Scheduled Meds: . amLODipine  10 mg Oral Daily  . donepezil  5 mg Oral QHS  . metoprolol succinate  50 mg Oral Daily  . mirtazapine  30 mg Oral QHS  . QUEtiapine  100 mg Oral QHS  . senna  1 tablet Oral BID  . sertraline  25 mg Oral Daily   Continuous Infusions: PRN Meds:.acetaminophen **OR** acetaminophen, bisacodyl, methocarbamol, ondansetron **OR** ondansetron (ZOFRAN) IV, oxyCODONE, polyethylene glycol Medications Prior to Admission:  Prior to Admission medications   Medication Sig Start Date End Date Taking? Authorizing Provider  acetaminophen (TYLENOL) 325 MG tablet Take 650 mg  by mouth every 8 (eight) hours as needed (pain in left hip).   Yes [provider]  Amino Acids-Protein Hydrolys (FEEDING SUPPLEMENT, PRO-STAT SUGAR FREE 64,) LIQD Take 30 mLs by mouth 2 (two) times daily.   Yes [provider]  amLODipine (NORVASC) 10 MG tablet Take 1 tablet (10 mg total) by mouth daily. 10/01/17  Yes Mariel Aloe, MD  donepezil (ARICEPT) 5 MG tablet Take 5 mg by mouth at bedtime.   Yes [provider]  doxycycline (VIBRA-TABS) 100 MG tablet Take 100 mg by mouth 2 (two) times daily.   Yes [provider]  magnesium hydroxide (MILK OF MAGNESIA) 400 MG/5ML suspension Take 30 mLs by mouth daily as needed for mild constipation.   Yes [provider]  methocarbamol (ROBAXIN) 500 MG tablet Take 500 mg by mouth every 8 (eight) hours as needed for muscle spasms.   Yes [provider]  metoprolol succinate (TOPROL-XL) 50 MG 24 hr tablet Take 50 mg by mouth daily. Take with or immediately following a meal.   Yes [provider]  mirtazapine (REMERON) 30 MG tablet Take 1 tablet (30 mg total) by mouth at bedtime. 10/01/17  Yes Mariel Aloe, MD  polyethylene glycol (MIRALAX / GLYCOLAX) packet Take 17 g by mouth daily as needed. Patient taking  differently: Take 17 g by mouth daily as needed for mild constipation.  10/01/17  Yes Mariel Aloe, MD  pravastatin (PRAVACHOL) 20 MG tablet Take 20 mg by mouth daily.   Yes [provider]  QUEtiapine (SEROQUEL) 100 MG tablet Take 1 tablet (100 mg total) by mouth at bedtime. For sleep 10/01/17  Yes Mariel Aloe, MD  sertraline (ZOLOFT) 25 MG tablet Take 25 mg by mouth daily.   Yes [provider]  diclofenac sodium (VOLTAREN) 1 % GEL Apply 2 g topically 4 (four) times daily. 10/01/17   Mariel Aloe, MD  feeding supplement, ENSURE ENLIVE, (ENSURE ENLIVE) LIQD Take 237 mLs by mouth 2 (two) times daily between meals. 10/01/17   Mariel Aloe, MD   Allergies  Allergen Reactions  . Morphine And Related Nausea And Vomiting  . Oxycontin [Oxycodone Hcl] Nausea And Vomiting  . Percocet [Oxycodone-Acetaminophen] Nausea And Vomiting   Review of Systems  Musculoskeletal: Positive for back pain.    Physical Exam  Constitutional: She appears cachectic. She appears ill. Nasal cannula in place.  Cardiovascular: Normal rate, regular rhythm and normal heart sounds.  Pulmonary/Chest: Effort normal and breath sounds normal.  Musculoskeletal:  -generalized weakness  Neurological: She is alert.  - oriented to person and place  Skin: Skin is warm and dry.    Vital Signs: BP 113/71 (BP Location: Left Arm)   Pulse 96   Temp (!) 97.5 F (36.4 C) (Oral)   Resp 14   Ht 5\' 9"  (1.753 m)   Wt 62.8 kg   SpO2 94%   BMI 20.45 kg/m  Pain Scale: 0-10   Pain Score: Asleep   SpO2: SpO2: 94 % O2 Device:SpO2: 94 % O2 Flow Rate: .O2 Flow Rate (L/min): 2 L/min  IO: Intake/output summary:   Intake/Output Summary (Last 24 hours) at 10/22/2017 0901 Last data filed at 10/22/2017 0657 Gross per 24 hour  Intake 280 ml  Output 0 ml  Net 280 ml    LBM: Last BM Date: 10/20/17 Baseline Weight: Weight: 52.2 kg Most recent weight: Weight: 62.8 kg     Palliative Assessment/Data: 30 % at  best  Discussed with Dr Eliseo Squires  Time In: 1100 Time Out: 1215 Time Total: 75 minutes Greater than 50%  of this time was spent counseling and coordinating care related to the above assessment and plan.  Signed by: Wadie Lessen, NP   Please contact Palliative Medicine Team phone at [phone] for questions and concerns.  For individual provider: See Shea Evans

## 2017-10-22 NOTE — Clinical Social Work Note (Signed)
Clinical Social Work Assessment  Patient Details  Name: Monique Rush MRN: [id number] Date of Birth: 08/08/1940  Date of referral:  10/22/17               Reason for consult:  Facility Placement, Discharge Planning                Permission sought to share information with:  Family Supports Permission granted to share information::  No(Patient oriented to self only)  Name::        Agency::     Relationship::     Contact Information:     Housing/Transportation Living arrangements for the past 2 months:  Skilled Nursing Facility(From [facility]) Source of Information:  Adult Children(Son-Monique Rush and Daughter Monique Rush) Patient Interpreter Needed:  None Criminal Activity/Legal Involvement Pertinent to Current Situation/Hospitalization:  No - Comment as needed Significant Relationships:  Adult Children Lives with:  Facility Resident Do you feel safe going back to the place where you live?  Yes(Adult children really like [facility]), however they have no MA LTC beds) Need for family participation in patient care:  Yes (Comment)  Care giving concerns:  CSW talked with patient's adult children regarding continued SNF stay for rehab/LTC at discharge. Per son and daughter, patient will need LTC and they cannot care for mother at home and patient has no resources to pay for LTC privately.  Social Worker assessment / plan:  CSW talked with patient's son Monique Rush ([phone], and daughter Monique Rush ([phone]) regarding discharge disposition for their mother, who is currently receiving ST rehab at [facility]) SNF. Children aware that their mother will need LTC and they were advised by SW at [facility] that no Medicaid beds available for a few years. Ms. Easterly applied for LTC Medicaid for patient last week and this was discussed. Children aware that their mother is not doing well medically due to CA diagnosis and will need LTC, but unsure of what to do as patient has  no financial resources, not do they and this was discussed at length. CSW provided with paperwork from [facility] worker regarding additional information needed.   Call made to [facility] regarding patient, the situation and her need for LTC after ST rehab (if approved) and she is willing to have information reviewed by appropriate staff at [facility] (son's preference) and [facility].  Per Monique Rush, they will have to talk with Medicaid worker to confirm that all information needed to process application received and if patient appears eligible for Medicaid. Daughter asked to contact Medicaid worker and give her Monique Rush's name, phone number and permission to talk with Monique Rush when she calls.   Employment status:  Retired Brewing technologist) PT Recommendations:  [place] / Referral to community resources:  Other (Comment Required)(Talked with children regarding continued SNF need)  Patient/Family's Response to care: No concerns expressed by family regarding care during hospitalization.  Patient/Family's Understanding of and Emotional Response to Diagnosis, Current Treatment, and Prognosis:  Family aware that patient is terminal and needs ongoing skilled care and will willing to do what is necessary to make this happen.  Emotional Assessment Appearance:  Appears stated age Attitude/Demeanor/Rapport:  Unable to Assess(Greeted patient, but talked with adult children in another location) Affect (typically observed):  Unable to Assess Orientation:  Oriented to Self Alcohol / Substance use:  Never Used Psych involvement (Current and /or in the community):  No (Comment)  Discharge Needs  Concerns to be addressed:  Discharge Planning Concerns Readmission within the last 30 days:  Yes Current discharge risk:  None Barriers to Discharge:  Continued Medical Work up, [place], Monique Winship Bradley, LCSW 10/22/2017, 1:26  PM

## 2017-10-23 DIAGNOSIS — D649 Anemia, unspecified: Secondary | ICD-10-CM

## 2017-10-23 DIAGNOSIS — N63 Unspecified lump in unspecified breast: Secondary | ICD-10-CM

## 2017-10-23 DIAGNOSIS — C50919 Malignant neoplasm of unspecified site of unspecified female breast: Secondary | ICD-10-CM

## 2017-10-23 DIAGNOSIS — R531 Weakness: Secondary | ICD-10-CM

## 2017-10-23 MED ORDER — SENNA 8.6 MG PO TABS: 1.0000 | ORAL_TABLET | Freq: Two times a day (BID) | ORAL | 0 refills | Status: AC

## 2017-10-23 MED ORDER — ENSURE ENLIVE PO LIQD
237.0000 mL | Freq: Two times a day (BID) | ORAL | Status: DC
  Administered 2017-10-23: 237 mL via ORAL

## 2017-10-23 MED ORDER — OXYCODONE HCL 5 MG PO TABS: 2.5000 mg | ORAL_TABLET | Freq: Four times a day (QID) | ORAL | 0 refills | Status: DC | PRN

## 2017-10-23 NOTE — Progress Notes (Signed)
Physical Therapy Treatment Patient Details Name: Monique Rush MRN: [id number] DOB: 02-Oct-1940 Today's Date: 10/23/2017    History of Present Illness 77 y.o. female with medical history significant of left THA (2015), compression fx thoracic vertebra, encephalopathy, HTN, and dementia. Presenting with malaise and fatigue. newly diagnosed with breast cancer    PT Comments    Continuing work on functional mobility and activity tolerance;  Session focused on functional transfers (helped Ms. Duplessis on and off the bedside commode); Noting focus of care is on comfort and dignity -- today's session was congruent with these goals -- Still, will take Palliative Medicine's lead: If PT is not in synch with pt's goals of care, will sign off.   Follow Up Recommendations  SNF     Equipment Recommendations  None recommended by PT    Recommendations for Other Services       Precautions / Restrictions Precautions Precautions: Fall Restrictions Weight Bearing Restrictions: No    Mobility  Bed Mobility                  Transfers Overall transfer level: Needs assistance Equipment used: Rolling walker (2 wheeled) Transfers: Sit to/from [facility] Sit to Stand: Min assist Stand pivot transfers: Mod assist;+2 safety/equipment       General transfer comment: Stood 3 times, with noted dependence on UE support; Assist to rise and stabilize; Pivot steps to [facility], then form BSC back to recliner  Ambulation/Gait                 Stairs             Wheelchair Mobility    Modified Rankin (Stroke Patients Only)       Balance     Sitting balance-Leahy Scale: Fair Sitting balance - Comments: close guard for safety     Standing balance-Leahy Scale: Poor Standing balance comment: Reliant on UE support                            Cognition Arousal/Alertness: Awake/alert Behavior During Therapy: WFL for tasks assessed/performed Overall  Cognitive Status: History of cognitive impairments - at baseline                                 General Comments: Baseline dementia      Exercises      General Comments        Pertinent Vitals/Pain Pain Assessment: Faces Faces Pain Scale: Hurts little more Pain Location: back Pain Descriptors / Indicators: Aching;Discomfort;Sore Pain Intervention(s): Monitored during session;Repositioned    Home Living                      Prior Function            PT Goals (current goals can now be found in the care plan section) Acute Rehab PT Goals Patient Stated Goal: Specific to this session, she asked for help with getting to bedside comode PT Goal Formulation: Patient unable to participate in goal setting Time For Goal Achievement: 11/04/17 Potential to Achieve Goals: Fair Progress towards PT goals: Progressing toward goals(slowly)    Frequency    Min 2X/week      PT Plan Current plan remains appropriate    Co-evaluation              AM-PAC PT "6 Clicks" Daily Activity  Outcome  Measure  Difficulty turning over in bed (including adjusting bedclothes, sheets and blankets)?: A Little Difficulty moving from lying on back to sitting on the side of the bed? : A Lot Difficulty sitting down on and standing up from a chair with arms (e.g., wheelchair, bedside commode, etc,.)?: Unable Help needed moving to and from a bed to chair (including a wheelchair)?: A Little Help needed walking in hospital room?: A Lot Help needed climbing 3-5 steps with a railing? : Total 6 Click Score: 12    End of Session Equipment Utilized During Treatment: Gait belt;Oxygen(supplemental O2 1 L) Activity Tolerance: Patient limited by fatigue Patient left: in chair;with call bell/phone within reach;with chair alarm set Nurse Communication: Mobility status;Other (comment)(small, firm BM) PT Visit Diagnosis: Muscle weakness (generalized) (M62.81);Difficulty in walking,  not elsewhere classified (R26.2);History of falling (Z91.81)     Time: 1779-3903 PT Time Calculation (min) (ACUTE ONLY): 16 min  Charges:  $Therapeutic Activity: 8-22 mins                     Roney Marion, PT  Acute Rehabilitation Services Pager [phone] Office [place] 10/23/2017, 1:35 PM

## 2017-10-23 NOTE — Clinical Social Work Placement (Addendum)
   CLINICAL SOCIAL WORK PLACEMENT  NOTE 10/23/17 - DISCHARGED TO [facility] VIA AMBULANCE INSURANCE AUTH INFORMATION IN COMMENTS SECTION BELOW  Date:  10/23/2017  Patient Details  Name: Monique Rush MRN: [id number] Date of Birth: 06/17/1940  Clinical Social Work is seeking post-discharge placement for this patient at the [place] level of care (*CSW will initial, date and re-position this form in  chart as items are completed):  Yes   Patient/family provided with [place] Work Department's list of facilities offering this level of care within the geographic area requested by the patient (or if unable, by the patient's family).  Yes   Patient/family informed of their freedom to choose among providers that offer the needed level of care, that participate in Medicare, Medicaid or managed care program needed by the patient, have an available bed and are willing to accept the patient.  Yes   Patient/family informed of [facility]'s ownership interest in [facility] and [facility], as well as of the fact that they are under no obligation to receive care at these facilities.  PASRR submitted to EDS on       PASRR number received on       Existing PASRR number confirmed on 10/22/17     FL2 transmitted to all facilities in geographic area requested by pt/family on 10/22/17     FL2 transmitted to all facilities within larger geographic area on       Patient informed that his/her managed care company has contracts with or will negotiate with certain facilities, including the following:        Yes   Patient/family informed of bed offers received.  Patient chooses bed at  [facility]     Physician recommends and patient chooses bed at      Patient to be transferred to  [facility] on 10/23/17.  Patient to be transferred to facility by Ambulance     Patient family notified on 10/23/17 of transfer.  Name of family member notified:   Daughter and son, Merry Proud and Imogene Gravelle     PHYSICIAN      Additional Comment:  10/23/17 - Insurance auth received from [employer] with [facility]. Auth #[id number] for 3 days. Next review date - 9/5 and RUG Level - RVC. 536 therapy minutes approved per week and FICC is Cathy G[facility]. This information provided to Gayla Medicus with [facility] on 9/3.  _______________________________________________ Sable Feil, LCSW 10/23/2017, 5:57 PM

## 2017-10-23 NOTE — Progress Notes (Signed)
Progress Note    Monique Rush  OFB:[id number] DOB: 01/24/1941  DOA: 10/20/2017 PCP: Benito Mccreedy, MD    Brief Narrative:    77 y.o. female with medical history significant of HTN, dementia  Admitted for acute anemia and likely newly diagnosed breast cancer with multiple metastases.  Has been seen by palliative medicine, and made for palliative approach.   Assessment/Plan:   Active Problems:   Hypokalemia   Abdominal pain   Anemia   Breast mass   Multiple lung nodules   Pressure injury of skin   Palliative care by specialist   DNR (do not resuscitate)  FTT/malnutrition -most likely due to metastatic cancer plus dementia -Continue with supplements  Suspected Breast mass with possible mets to liver/bone/lung -patient has dementia and she came recently from SNF -palliative care met with family-no plan for further work-up, or treatment,, plan to transition towards comfort  Anemia  -s/p 1 unit PRBC  Hypokalemia  -repleted  Pain/compression fractures -most like due to metastatic cancer -PRN pain medications  Dementia  -has been worsening over last few years  History of hypertension -BB  History of hyperlipidemia -hold statin   Family Communication/Anticipated D/C date and plan/Code Status   DVT prophylaxis: scd-subcu heparin Code Status: DNR Family Communication: None at bedside Disposition Plan: Discussion with palliative, but likely will need SNF placement with hospice   Medical Consultants:    Palliative care.    Subjective:   Awake, she denies any complaints  Objective:    Vitals:   10/22/17 1720 10/22/17 2057 10/23/17 0507 10/23/17 0744  BP: 123/78 122/66 116/65 134/65  Pulse: 98 97 83 100  Resp: _0 (!) 21  Temp: 98.1 F (36.7 C) 97.8 F (36.6 C) 98.4 F (36.9 C) 97.8 F (36.6 C)  TempSrc: Oral Oral  Oral  SpO2: 94% 95% 96% (!) 89%  Weight:      Height:        Intake/Output Summary (Last 24 hours) at  10/23/2017 1312 Last data filed at 10/23/2017 0918 Gross per 24 hour  Intake 270 ml  Output 1375 ml  Net -1105 ml   Filed Weights   10/20/17 0941 10/20/17 2155  Weight: 52.2 kg 62.8 kg    Exam:  Frail, chronically ill-appearing, sitting in recliner eating breakfast Good air entry bilaterally, no wheezing Regular rate and rhythm Abdomen Soft Mild lower extremity edema   Data Reviewed:    Labs: Labs show the following:   Basic Metabolic Panel: Recent Labs  Lab 10/20/17 1031 10/20/17 2115 10/21/17 0716 10/22/17 0359  NA 137  --  139 139  K 3.1*  --  4.4 3.9  CL 99  --  103 107  CO2 24  --  24 24  GLUCOSE 100*  --  82 87  BUN 19  --  14 18  CREATININE 0.83  --  0.79 0.88  CALCIUM 9.1  --  9.2 9.2  MG  --  2.0 1.8  --   PHOS  --  3.9 3.7  --    GFR Estimated Creatinine Clearance: 53.1 mL/min (by C-G formula based on SCr of 0.88 mg/dL). Liver Function Tests: Recent Labs  Lab 10/20/17 1031 10/21/17 0716 10/22/17 0359  AST 58* 74* 57*  ALT _1 ALKPHOS 232* 262* 213*  BILITOT 0.7 1.1 0.6  PROT 5.8* 6.2* 5.3*  ALBUMIN 2.2* 2.4* 2.0*   No results for input(s): LIPASE, AMYLASE in the last 168 hours. No  results for input(s): AMMONIA in the last 168 hours. Coagulation profile No results for input(s): INR, PROTIME in the last 168 hours.  CBC: Recent Labs  Lab 10/20/17 1031 10/21/17 0716 10/22/17 0359  WBC 10.5 10.0 8.2  HGB 7.2* 9.5* 8.5*  HCT 24.2* 31.1* 27.3*  MCV 91.3 90.1 89.8  PLT 136* 114* 90*   Cardiac Enzymes: No results for input(s): CKTOTAL, CKMB, CKMBINDEX, TROPONINI in the last 168 hours. BNP (last 3 results) No results for input(s): PROBNP in the last 8760 hours. CBG: Recent Labs  Lab 10/20/17 1057  GLUCAP 94   D-Dimer: No results for input(s): DDIMER in the last 72 hours. Hgb A1c: No results for input(s): HGBA1C in the last 72 hours. Lipid Profile: No results for input(s): CHOL, HDL, LDLCALC, TRIG, CHOLHDL, LDLDIRECT in the  last 72 hours. Thyroid function studies: Recent Labs    10/21/17 0716  TSH 2.907   Anemia work up: Recent Labs    10/20/17 2115  VITAMINB12 793  FOLATE 8.9  FERRITIN 406*  TIBC 263  IRON 61  RETICCTPCT 2.6   Sepsis Labs: Recent Labs  Lab 10/20/17 1031 10/21/17 0716 10/22/17 0359  WBC 10.5 10.0 8.2    Microbiology Recent Results (from the past 240 hour(s))  Urine Culture     Status: None   Collection Time: 10/20/17 12:52 PM  Result Value Ref Range Status   Specimen Description URINE, RANDOM  Final   Special Requests NONE  Final   Culture   Final    NO GROWTH Performed at [facility], [address] [place], [place] [address]    Report Status 10/21/2017 FINAL  Final  MRSA PCR Screening     Status: None   Collection Time: 10/21/17 12:59 AM  Result Value Ref Range Status   MRSA by PCR NEGATIVE NEGATIVE Final    Comment:        The GeneXpert MRSA Assay (FDA approved for NASAL specimens only), is one component of a comprehensive MRSA colonization surveillance program. It is not intended to diagnose MRSA infection nor to guide or monitor treatment for MRSA infections. Performed at [facility], [address] [place], [place] [address]     Procedures and diagnostic studies:  No results found.  Medications:   . amLODipine  10 mg Oral Daily  . donepezil  5 mg Oral QHS  . metoprolol succinate  50 mg Oral Daily  . mirtazapine  30 mg Oral QHS  . QUEtiapine  100 mg Oral QHS  . senna  1 tablet Oral BID  . sertraline  25 mg Oral Daily   Continuous Infusions:   LOS: 1 day      [facility]   *Please refer to [email or web address], password [email or web address] to get updated schedule on who will round on this patient, as hospitalists switch teams weekly. If 7PM-7AM, please contact night-coverage at [email or web address], password [email or web address] for any overnight needs.  10/23/2017, 1:12 PM          

## 2017-10-23 NOTE — Progress Notes (Signed)
Report given to [facility] at [facility]. Waiting for [facility] to transport pt.

## 2017-10-23 NOTE — Discharge Summary (Signed)
Monique Rush, is a 77 y.o. female  DOB 1940/07/29  MRN [id number].  Admission date:  10/20/2017  Admitting Physician  Toy Baker, MD  Discharge Date:  10/23/2017   Primary MD  Benito Mccreedy, MD  Recommendations for primary care physician for things to follow:  -Please consult palliative medicine at your facility   Admission Diagnosis  Lung mass [R91.8] Abdominal pain [R10.9] Symptomatic anemia [D64.9] Other fatigue [R53.83] Anemia [D64.9]   Discharge Diagnosis  Lung mass [R91.8] Abdominal pain [R10.9] Symptomatic anemia [D64.9] Other fatigue [R53.83] Anemia [D64.9]    Active Problems:   Hypokalemia   Abdominal pain   Anemia   Breast mass   Multiple lung nodules   Pressure injury of skin   Palliative care by specialist   DNR (do not resuscitate)      Past Medical History:  Diagnosis Date  . Encephalopathy   . Hypertension   . Insomnia   . Urine retention     Past Surgical History:  Procedure Laterality Date  . ANKLE SURGERY     Left  . CHOLECYSTECTOMY    . HIP ARTHROPLASTY Left 02/11/2014   Procedure: ARTHROPLASTY BIPOLAR HIP;  Surgeon: Mauri Pole, MD;  Location: [facility];  Service: Orthopedics;  Laterality: Left;       History of present illness and  Hospital Course:     Kindly see H&P for history of present illness and admission details, please review complete Labs, Consult reports and Test reports for all details in brief  HPI  from the history and physical done on the day of admission 10/20/2017 Monique Rush is a 77 y.o. female with medical history significant of HTN, dementia    Presented with   Malaise, fatigue Patient was recently admitted for acute encephalopathy and was found to have UTI and hypercalcemia blood cultures showed gram-positive rods which felt to be contaminant patient completed antibiotics, nursing hospital calcium is  improved with IV fluids thought to be secondary hydrochlorothiazide initially admitted from 6 to 12 August. Patient was noted to be anemic during last admission anemia panel at that time unremarkable no active bleeding noted felt that her hemoglobin drop was secondary to IV fluids  Patient pleasantly confused uanble to provide3 hx but does report abdominal pain.    Regarding pertinent Chronic problems: Pretension on metoprolol and amlodipine hold hydrochlorothiazide has been recently discontinued secondary to hypercalcemia    While in ER: Noted to have Hg down 10.1->8.6->7.2 plt 136 Alb 2.2 Alk Phosph 232 d.dimer was elevated and CTA was ordered  CTA showed right breast mass, patholic fractures    Hospital Course    77 y.o.femalewith medical history significant of HTN, dementiaAdmitted for acute anemia and likely newly diagnosed breast cancer with multiple metastases.  Has been seen by palliative medicine, and made for palliative approach.  FTT/malnutrition/dilatation -most likely due to metastatic cancer plus dementia -Continue with supplements -PT consult appreciated, plan for SNF placement  Suspected Breast mass with possible mets to liver/bone/lung -patient has dementia  and she came recently from SNF -palliative care met with family-no plan for further work-up, diagnostics or treatment,  Anemia -s/p 1 unit PRBC, hemoglobin remained stable since  Hypokalemia -repleted  Pain/compression fractures -most like due to metastatic cancer -PRN pain medications  Dementia  -has been worsening over last few years  History of hypertension -BB  History of hyperlipidemia -Continue with statin   Discharge Condition:   Stable at time of discharge -Consult palliative medicine at facility       Discharge Instructions  and  Discharge Medications   Discharge Instructions    Discharge instructions   Complete by:  As directed    Follow with Primary  MD Benito Mccreedy, MD or SNF physician within 3 to 7 days  Get CBC, CMP,  checked  by Primary MD next visit.    Activity: As tolerated with Full fall precautions use walker/cane & assistance as needed   Disposition SNF   Diet: Soft diet with thin liquid   On your next visit with your primary care physician please Get Medicines reviewed and adjusted.   Please request your Prim.MD to go over all Hospital Tests and Procedure/Radiological results at the follow up, please get all Hospital records sent to your Prim MD by signing hospital release before you go home.   If you experience worsening of your admission symptoms, develop shortness of breath, life threatening emergency, suicidal or homicidal thoughts you must seek medical attention immediately by calling 911 or calling your MD immediately  if symptoms less severe.  You Must read complete instructions/literature along with all the possible adverse reactions/side effects for all the Medicines you take and that have been prescribed to you. Take any new Medicines after you have completely understood and accpet all the possible adverse reactions/side effects.   Do not drive, operating heavy machinery, perform activities at heights, swimming or participation in water activities or provide baby sitting services if your were admitted for syncope or siezures until you have seen by Primary MD or a Neurologist and advised to do so again.  Do not drive when taking Pain medications.    Do not take more than prescribed Pain, Sleep and Anxiety Medications  Special Instructions: If you have smoked or chewed Tobacco  in the last 2 yrs please stop smoking, stop any regular Alcohol  and or any Recreational drug use.  Wear Seat belts while driving.   Please note  You were cared for by a hospitalist during your hospital stay. If you have any questions about your discharge medications or the care you received while you were in the hospital  after you are discharged, you can call the unit and asked to speak with the hospitalist on call if the hospitalist that took care of you is not available. Once you are discharged, your primary care physician will handle any further medical issues. Please note that NO REFILLS for any discharge medications will be authorized once you are discharged, as it is imperative that you return to your primary care physician (or establish a relationship with a primary care physician if you do not have one) for your aftercare needs so that they can reassess your need for medications and monitor your lab values.   Increase activity slowly   Complete by:  As directed      Allergies as of 10/23/2017      Reactions   Morphine And Related Nausea And Vomiting   Oxycontin [oxycodone Hcl] Nausea And Vomiting   Percocet [oxycodone-acetaminophen]  Nausea And Vomiting      Medication List    STOP taking these medications   doxycycline 100 MG tablet Commonly known as:  VIBRA-TABS     TAKE these medications   acetaminophen 325 MG tablet Commonly known as:  TYLENOL Take 650 mg by mouth every 8 (eight) hours as needed (pain in left hip).   amLODipine 10 MG tablet Commonly known as:  NORVASC Take 1 tablet (10 mg total) by mouth daily.   diclofenac sodium 1 % Gel Commonly known as:  VOLTAREN Apply 2 g topically 4 (four) times daily.   donepezil 5 MG tablet Commonly known as:  ARICEPT Take 5 mg by mouth at bedtime.   feeding supplement (ENSURE ENLIVE) Liqd Take 237 mLs by mouth 2 (two) times daily between meals.   feeding supplement (PRO-STAT SUGAR FREE 64) Liqd Take 30 mLs by mouth 2 (two) times daily.   magnesium hydroxide 400 MG/5ML suspension Commonly known as:  MILK OF MAGNESIA Take 30 mLs by mouth daily as needed for mild constipation.   methocarbamol 500 MG tablet Commonly known as:  ROBAXIN Take 500 mg by mouth every 8 (eight) hours as needed for muscle spasms.   metoprolol succinate 50 MG 24  hr tablet Commonly known as:  TOPROL-XL Take 50 mg by mouth daily. Take with or immediately following a meal.   mirtazapine 30 MG tablet Commonly known as:  REMERON Take 1 tablet (30 mg total) by mouth at bedtime.   oxyCODONE 5 MG immediate release tablet Commonly known as:  Oxy IR/ROXICODONE Take 0.5 tablets (2.5 mg total) by mouth every 6 (six) hours as needed for moderate pain.   polyethylene glycol packet Commonly known as:  MIRALAX / GLYCOLAX Take 17 g by mouth daily as needed. What changed:  reasons to take this   pravastatin 20 MG tablet Commonly known as:  PRAVACHOL Take 20 mg by mouth daily.   QUEtiapine 100 MG tablet Commonly known as:  SEROQUEL Take 1 tablet (100 mg total) by mouth at bedtime. For sleep   senna 8.6 MG Tabs tablet Commonly known as:  SENOKOT Take 1 tablet (8.6 mg total) by mouth 2 (two) times daily.   sertraline 25 MG tablet Commonly known as:  ZOLOFT Take 25 mg by mouth daily.         Diet and Activity recommendation: See Discharge Instructions above   Consults obtained -  palliative medicine   Major procedures and Radiology Reports - PLEASE review detailed and final reports for all details, in brief -      Ct Abdomen Pelvis Wo Contrast  Result Date: 10/21/2017 CLINICAL DATA:  Hematuria. EXAM: CT ABDOMEN AND PELVIS WITHOUT CONTRAST TECHNIQUE: Multidetector CT imaging of the abdomen and pelvis was performed following the standard protocol without IV contrast. COMPARISON:  CT abdomen and pelvis February 20, 2014 and abdominal radiograph October 20, 2017. FINDINGS: Moderate motion degraded examination. LOWER CHEST: Small pleural effusions and atelectasis. HEPATOBILIARY: Numerous hypodense masses throughout the liver difficult to quantify by noncontrast CT. Status post cholecystectomy. PANCREAS: Atrophic, nonacute. SPLEEN: Normal. ADRENALS/URINARY TRACT: Kidneys are orthotopic, demonstrating normal size and morphology. Contrast excretion limits  sensitivity for small nonobstructing nephrolithiasis. Mild RIGHT hydroureteronephrosis and moderately atrophic RIGHT kidney. Delayed RIGHT contrast excretion. Limited assessment for renal mass by noncontrast CT. Contrast in the urinary bladder. Normal adrenal glands. STOMACH/BOWEL: The stomach, small and large bowel are normal in course and caliber without inflammatory changes. Small and large bowel air-fluid levels. 8.3 cm stool  distended rectum with mild perirectal fat stranding. Normal appendix. VASCULAR/LYMPHATIC: Aortoiliac vessels are normal in course and caliber. Severe calcific atherosclerosis. Included chest demonstrates RIGHT axillary lymphadenopathy measuring to 15 mm short axis with pericapsular inflammation. REPRODUCTIVE: Normal. OTHER: No intraperitoneal free fluid or free air. MUSCULOSKELETAL: Destructive 2.6 cm sternal mass better seen on yesterday's CT chest. New moderate L1 and mild L2 fractures with underlying sclerosis and irregularity. Severe T12, moderate T10 and moderate T8 fractures. Abnormal RIGHT breast skin thickening and asymmetric densities. Streak artifact from LEFT hip arthroplasty. IMPRESSION: 1. Motion degraded examination. Hepatic masses consistent with metastatic disease, limited assessment by noncontrast CT. 2. Moderately atrophic RIGHT kidney with moderate hydroureteronephrosis and delayed function. 3. Multiple osseous metastasis and likely pathologic thoracolumbar fractures. 4. Redemonstration of suspicious RIGHT axillary lymphadenopathy with RIGHT breast skin thickening and potential mass. Recommend mammogram on non emergent basis. Aortic Atherosclerosis (ICD10-I70.0). Electronically Signed   By: Elon Alas M.D.   On: 10/21/2017 05:20   Dg Chest 2 View  Result Date: 10/20/2017 CLINICAL DATA:  Increasing lethargy over the past 2 days. EXAM: CHEST - 2 VIEW COMPARISON:  PA and lateral chest 09/25/2017 and 02/10/2014. FINDINGS: Lung volumes are somewhat low but the lungs  are clear. Heart size is normal. No pneumothorax or pleural fluid. Remote compression fracture at the thoracolumbar junction is noted. No acute bony abnormality. IMPRESSION: No acute disease. Electronically Signed   By: Inge Rise M.D.   On: 10/20/2017 11:14   Dg Chest 2 View  Result Date: 09/25/2017 CLINICAL DATA:  Confusion EXAM: CHEST - 2 VIEW COMPARISON:  February 10, 2014 FINDINGS: There is no edema or consolidation. The heart size and pulmonary vascularity are normal. No adenopathy. No evident bone lesions. There is aortic atherosclerosis. IMPRESSION: No edema or consolidation.  There is aortic atherosclerosis. Aortic Atherosclerosis (ICD10-I70.0). Electronically Signed   By: Lowella Grip III M.D.   On: 09/25/2017 12:19   Dg Thoracic Spine 2 View  Result Date: 09/29/2017 CLINICAL DATA:  Pt states chronic back pain mostly in lower tspine. Advised worsened today and was unable to lay flat on bed for images. EXAM: THORACIC SPINE 2 VIEWS COMPARISON:  Chest radiograph, 09/25/2017 FINDINGS: There are multiple fractures. Mild compression fracture of T8. Mild to moderate compression fracture of T10. Moderate to severe compression fracture of T12. Moderate to severe compression fracture of L1. These fractures were present on the prior lateral chest radiograph. These were not evident on a lateral chest radiograph from 02/10/2014. Bones are diffusely demineralized. There is a mild-to-moderate dextroscoliosis, apex in the midthoracic spine. No spondylolisthesis. No bone lesion. Soft tissues are unremarkable. IMPRESSION: 1. Multiple thoracolumbar vertebral compression fractures of unclear chronicity. 2. Mild-to-moderate dextroscoliosis of the midthoracic spine. 3. Diffuse bony demineralization. Electronically Signed   By: Lajean Manes M.D.   On: 09/29/2017 16:58   Dg Lumbar Spine 2-3 Views  Result Date: 09/29/2017 CLINICAL DATA:  Pt states chronic back pain mostly in lower tspine. Advised worsened  today and was unable to lay flat on bed for images. Best obtained received EXAM: LUMBAR SPINE - 2-3 VIEW COMPARISON:  CT, 02/20/2014 FINDINGS: Moderate to severe compression fractures of T12 and L1. Mild compression fracture of L2. Normal height of the L3, L4 and L5 vertebrae. No spondylolisthesis. Moderate loss of disc height throughout the lumbar spine. Bones are diffusely demineralized. Moderate dextroscoliosis, apex at L2-L3. 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 IMPRESSION: 1. Multiple compression fractures as described which are new when compared  to the prior CT scan, but of unclear chronicity. 2. Diffuse bony demineralization, disk degenerative changes and dextroscoliosis similar to the prior CT scan. Electronically Signed   By: Lajean Manes M.D.   On: 09/29/2017 17:00   Dg Abd 1 View  Result Date: 10/20/2017 CLINICAL DATA:  Abdomen pain EXAM: ABDOMEN - 1 VIEW COMPARISON:  09/29/2017 FINDINGS: Normal bowel gas pattern. Left hip hemiarthroplasty. Moderate dextroscoliosis. No acute skeletal abnormality Contrast in the urinary bladder from recent CT chest. Bladder diverticulum on the left. Multiple right renal calculi as noted on prior CT. There is also some contrast in the right renal pelvis which appears mildly dilated. Incomplete evaluation. IMPRESSION: Normal bowel gas pattern Multiple right renal calculi with mild dilatation of the right renal pelvis. Electronically Signed   By: Franchot Gallo M.D.   On: 10/20/2017 21:31   Ct Head Wo Contrast  Result Date: 09/25/2017 CLINICAL DATA:  Confusion EXAM: CT HEAD WITHOUT CONTRAST TECHNIQUE: Contiguous axial images were obtained from the base of the skull through the vertex without intravenous contrast. COMPARISON:  02/12/2014 FINDINGS: Brain: Atrophic changes and chronic white matter ischemic change is seen. No findings to suggest acute hemorrhage, acute infarction or space-occupying mass lesion are noted. Vascular: No hyperdense vessel  or unexpected calcification. Skull: Normal. Negative for fracture or focal lesion. Sinuses/Orbits: No acute finding. Other: None. IMPRESSION: Chronic atrophic and ischemic changes without acute abnormality. Electronically Signed   By: Inez Catalina M.D.   On: 09/25/2017 14:17   Ct Angio Chest Pe W And/or Wo Contrast  Result Date: 10/20/2017 CLINICAL DATA:  Hypoxemia. Elevated D-dimer. BILATERAL UPPER and LOWER extremity edema. EXAM: CT ANGIOGRAPHY CHEST WITH CONTRAST TECHNIQUE: Multidetector CT imaging of the chest was performed using the standard protocol during bolus administration of intravenous contrast. Multiplanar CT image reconstructions and MIPs were obtained to evaluate the vascular anatomy. CONTRAST:  85m ISOVUE-370 IOPAMIDOL INJECTION 76% IV. COMPARISON:  None. FINDINGS: Beam hardening streak artifact is present as the patient was unable to raise the LEFT arm. Cardiovascular: Contrast opacification of pulmonary arteries is very good. No filling defects within either main pulmonary artery or their segmental branches in either lung to suggest pulmonary embolism. Heart mildly enlarged. LEFT ventricular hypertrophy. Moderate to severe LAD and RIGHT coronary artery atherosclerosis. No pericardial effusion. Moderate to severe atherosclerosis involving the thoracic and proximal abdominal aorta with calcified and noncalcified plaque. No evidence of aortic aneurysm. Mediastinum/Nodes: 2 adjacent enlarged RIGHT axillary lymph nodes measuring approximately 2.7 x 1.4 cm (series 5, image 73) and 2.3 x 1.5 cm (image 54). No pathologic LEFT axillary, mediastinal or hilar lymphadenopathy. Moderate-sized hiatal hernia. Normal appearing esophagus. Approximate 1.2 cm LEFT lobe thyroid nodule. Lungs/Pleura: Numerous BILATERAL lung nodules. Focal bronchiectasis involving the LEFT UPPER LOBE and the SUPERIOR segment LEFT LOWER LOBE. Emphysematous changes in both lungs. No confluent airspace consolidation. Small BILATERAL  pleural effusions and associated mild passive atelectasis in the lower lobes. Central airways patent with marked bronchial wall thickening. Upper Abdomen: Contrast is present in the visualized renal collecting systems likely related to an earlier test injection. Visualized upper abdomen unremarkable for the early arterial phase of enhancement. Musculoskeletal: Thoracolumbar levoscoliosis. Multiple compression fractures which are not likely acute, including T8, T10, T12, and L1. Sclerosis is present in each of these vertebral bodies. Mixed lytic and sclerotic lesions are present in the sternum. Other: I am suspicious that there is an incompletely imaged mass in the RIGHT breast (series 5, images 122-125). Review of the MIP images  confirms the above findings. IMPRESSION: 1. No evidence of pulmonary embolism. 2. RIGHT axillary lymphadenopathy.  Index nodes are measured above. 3. Numerous BILATERAL lung nodules indicating metastatic disease. 4. I am suspicious that there is an incompletely imaged mass in the RIGHT breast which would account for the RIGHT axillary lymphadenopathy. Diagnostic mammography and possible ultrasound is recommended in further evaluation. 5. Small BILATERAL pleural effusions and associated mild passive atelectasis in the lower lobes. 6. Compression fractures involving T8, T10, T12 and L1. As there is sclerosis in each of these vertebral bodies, these may be pathologic and related to metastatic disease. Mixed lytic and sclerotic metastases are present in the sternum. 7. Moderate-sized hiatal hernia. Aortic Atherosclerosis (ICD10-I70.0) and Emphysema (ICD10-J43.9). Electronically Signed   By: Evangeline Dakin M.D.   On: 10/20/2017 17:15    Micro Results     Recent Results (from the past 240 hour(s))  Urine Culture     Status: None   Collection Time: 10/20/17 12:52 PM  Result Value Ref Range Status   Specimen Description URINE, RANDOM  Final   Special Requests NONE  Final   Culture    Final    NO GROWTH Performed at [facility], [address] [place], [place] [address]    Report Status 10/21/2017 FINAL  Final  MRSA PCR Screening     Status: None   Collection Time: 10/21/17 12:59 AM  Result Value Ref Range Status   MRSA by PCR NEGATIVE NEGATIVE Final    Comment:        The GeneXpert MRSA Assay (FDA approved for NASAL specimens only), is one component of a comprehensive MRSA colonization surveillance program. It is not intended to diagnose MRSA infection nor to guide or monitor treatment for MRSA infections. Performed at [facility], [place] [address], [place], [place] [address]        Today   Subjective:   Tata Timmins today denies any complaints Objective:   Blood pressure 134/65, pulse 100, temperature 97.8 F (36.6 C), temperature source Oral, resp. rate (!) 21, height _0  (1.753 m), weight 62.8 kg, SpO2 (!) 89 %.   Intake/Output Summary (Last 24 hours) at 10/23/2017 1647 Last data filed at 10/23/2017 1300 Gross per 24 hour  Intake 240 ml  Output 575 ml  Net -335 ml    Exam  Frail, chronically ill-appearing, sitting in recliner eating breakfast Good air entry bilaterally, no wheezing Regular rate and rhythm Abdomen Soft Mild lower extremity edema   Data Review   CBC w Diff:  Lab Results  Component Value Date   WBC 8.2 10/22/2017   HGB 8.5 (L) 10/22/2017   HCT 27.3 (L) 10/22/2017   PLT 90 (L) 10/22/2017   LYMPHOPCT 22 09/25/2017   MONOPCT 6 09/25/2017   EOSPCT 2 09/25/2017   BASOPCT 1 09/25/2017    CMP:  Lab Results  Component Value Date   NA 139 10/22/2017   K 3.9 10/22/2017   CL 107 10/22/2017   CO2 24 10/22/2017   BUN 18 10/22/2017   CREATININE 0.88 10/22/2017   PROT 5.3 (L) 10/22/2017   ALBUMIN 2.0 (L) 10/22/2017   BILITOT 0.6 10/22/2017   ALKPHOS 213 (H) 10/22/2017   AST 57 (H) 10/22/2017   ALT 19 10/22/2017  .   Total Time in preparing paper work, data evaluation and todays exam - 92  minutes  Phillips Climes M.D on 10/23/2017 at 4:47 PM  [facility]   Office  [phone]

## 2017-10-23 NOTE — Clinical Social Work Note (Addendum)
Clinical Social Work Assessment  Patient Details  Name: Monique Rush MRN: [id number] Date of Birth: [birth year]  Date of referral:  10/22/17               Reason for consult:  Facility Placement, Discharge Planning                Permission sought to share information with:  Family Supports Permission granted to share information::  No(Patient oriented to self only)  Name::        Agency::     Relationship::     Contact Information:     Housing/Transportation Living arrangements for the past 2 months:  Skilled Nursing Facility(From [facility]) Source of Information:  Adult Children(Son-Monique Rush and Daughter Monique Rush) Patient Interpreter Needed:  None Criminal Activity/Legal Involvement Pertinent to Current Situation/Hospitalization:  No - Comment as needed Significant Relationships:  Adult Children  Lives with:  Facility Resident Do you feel safe going back to the place where you live?  Yes(Adult children really like [facility]), however they have no MA LTC beds) Need for family participation in patient care:  Yes (Comment)  Care giving concerns:  CSW talked with children Monique Rush [phone]) and Monique Rush [phone]) regarding d/c plan for patient and they were both in agreement with [facility] rehab and are hoping for LTC for patient post-rehab.    Social Worker assessment / plan:  CSW talked with siblings on 9/2 regarding discharge disposition for patient at CSW's work station on 16M. Patient from [facility] and children indicated their desire for patient to return there. However they had talked with the admissions director regarding LTC and a Medicaid bed and were advised that there is a very long waiting list for Medicaid beds at [facility].  CSW also advised by family that their mother does not have any money to pay at a facility privately and they have applied for LTC Medicaid at the [facility] in [place]. Discussed ensued regarding  exploring other facilities and sibling were agreeable after more discussion to exploring other SNF options. Also discussed with family that although patient is not doing well medically, she is not yet appropriate for residential hospice. With family's permission, call made to hospital liaison Tammy Shary Decamp with [facility]. Son reported that [place] will be very convenient for him as he lives nearby.    Employment status:  Retired Brewing technologist) PT Recommendations:  [place] / Referral to community resources:  Other (Comment Required)(Talked with children regarding continued SNF need)  Patient/Family's Response to care:  No concerns expressed by adult children regarding patient's care during hospitalization.  Patient/Family's Understanding of and Emotional Response to Diagnosis, Current Treatment, and Prognosis:  Children aware that their mother's health is failing and that she cannot return home and they also cannot care for her at home. They had already been talking with staff at [facility] regarding [facility] and had applied for LTC Medicaid.  Emotional Assessment Appearance:  Appears stated age Attitude/Demeanor/Rapport:  Unable to Assess(Greeted patient, but talked with adult children in another location) Affect (typically observed):  Unable to Assess Orientation:  Oriented to Self Alcohol / Substance use:  Never Used Psych involvement (Current and /or in the community):  No (Comment)  Discharge Needs  Concerns to be addressed:  Discharge Planning Concerns Readmission within the last 30 days:  Yes Current discharge risk:  None Barriers to Discharge:  Continued Medical Work up, Ship broker  Sable Feil, LCSW 10/23/2017, 5:39 PM

## 2017-10-23 NOTE — Discharge Instructions (Signed)
Follow with Primary MD Benito Mccreedy, MD or SNF physician within 3 to 7 days  Get CBC, CMP,  checked  by Primary MD next visit.    Activity: As tolerated with Full fall precautions use walker/cane & assistance as needed   Disposition SNF   Diet: Soft diet with thin liquid   On your next visit with your primary care physician please Get Medicines reviewed and adjusted.   Please request your Prim.MD to go over all Hospital Tests and Procedure/Radiological results at the follow up, please get all Hospital records sent to your Prim MD by signing hospital release before you go home.   If you experience worsening of your admission symptoms, develop shortness of breath, life threatening emergency, suicidal or homicidal thoughts you must seek medical attention immediately by calling 911 or calling your MD immediately  if symptoms less severe.  You Must read complete instructions/literature along with all the possible adverse reactions/side effects for all the Medicines you take and that have been prescribed to you. Take any new Medicines after you have completely understood and accpet all the possible adverse reactions/side effects.   Do not drive, operating heavy machinery, perform activities at heights, swimming or participation in water activities or provide baby sitting services if your were admitted for syncope or siezures until you have seen by Primary MD or a Neurologist and advised to do so again.  Do not drive when taking Pain medications.    Do not take more than prescribed Pain, Sleep and Anxiety Medications  Special Instructions: If you have smoked or chewed Tobacco  in the last 2 yrs please stop smoking, stop any regular Alcohol  and or any Recreational drug use.  Wear Seat belts while driving.   Please note  You were cared for by a hospitalist during your hospital stay. If you have any questions about your discharge medications or the care you received while you were  in the hospital after you are discharged, you can call the unit and asked to speak with the hospitalist on call if the hospitalist that took care of you is not available. Once you are discharged, your primary care physician will handle any further medical issues. Please note that NO REFILLS for any discharge medications will be authorized once you are discharged, as it is imperative that you return to your primary care physician (or establish a relationship with a primary care physician if you do not have one) for your aftercare needs so that they can reassess your need for medications and monitor your lab values.

## 2017-10-23 NOTE — Progress Notes (Signed)
Patient ID: ADAMARIE IZZO, female   DOB: [birth year], 77 y.o.   MRN: [id number]  This NP visited patient at the bedside as a follow up to  yesterday's Leonardo.  Patient chair engaged in conversation today.  She denies pain or discomfort currently.  I spoke to her son/Jeff Menees.  Focus of care remains comfort and dignity.  Patient will likely discharge today to SNF for rehabilitation with likely need for long-term care placement.  Patient has metastatic breast cancer and will likely decline rapidly over the next weeks to months.  I will be important to operate hospice services into her care plan for quality end-of-life care at the facility.  Family agrees  Discussed natural trajectory and expectations at end of life.   Discussed with family  the importance of continued conversation with the   medical providers regarding overall plan of care and treatment options,  ensuring decisions are within the context of the patients values and GOCs.  Questions and concerns addressed   Discussed with Dr Waldron Labs and Mauri Pole  Total time spent on the unit was 25 minutes  Greater than 50% of the time was spent in counseling and coordination of care  Wadie Lessen NP  Palliative Medicine Team Team Phone # [phone] Pager [phone]

## 2017-10-23 NOTE — Progress Notes (Signed)
Occupational Therapy Treatment Patient Details Name: Monique Rush MRN: [id number] DOB: 1941-01-01 Today's Date: 10/23/2017    History of present illness 77 y.o. female with medical history significant of left THA (2015), compression fx thoracic vertebra, encephalopathy, HTN, and dementia. Presenting with malaise and fatigue.    OT comments  Noted pt's focus now comfort/dignity. Checked with pt and she was agreeable to sitting up in chair for breakfast.  Positioned to her comfort and she verbalized that "this feels better". Will continue to follow in acute setting if she feels up to it.   Follow Up Recommendations  SNF    Equipment Recommendations       Recommendations for Other Services      Precautions / Restrictions Precautions Precautions: Fall Restrictions Weight Bearing Restrictions: No       Mobility Bed Mobility               General bed mobility comments: mod A roll, sidelying to sit  Transfers   Equipment used: 1 person hand held assist   Sit to Stand: Min assist Stand pivot transfers: Min assist       General transfer comment: assist to stand, stabilize and turn to chair    Balance     Sitting balance-Leahy Scale: Fair Sitting balance - Comments: close guard for safety                                   ADL either performed or assessed with clinical judgement   ADL   Eating/Feeding: Set up;Supervision/ safety;Sitting   Grooming: Set up;Supervision/safety;Sitting                   Toilet Transfer: Minimal assistance;Stand-pivot(to chair)             General ADL Comments: Pt wanted to get up to chair for breakfast, which was being delivered on hallway.  Positioned with 2 pillows behind her back.  Did SPT without RW.  Will need +2 for LB adls vs bed level     Vision       Perception     Praxis      Cognition Arousal/Alertness: Awake/alert Behavior During Therapy: WFL for tasks assessed/performed Overall  Cognitive Status: History of cognitive impairments - at baseline.  Follows one step commands consistently                                 General Comments: Baseline dementia        Exercises     Shoulder Instructions       General Comments      Pertinent Vitals/ Pain       Pain Assessment: Faces Faces Pain Scale: Hurts little more Pain Location: back Pain Descriptors / Indicators: Aching;Discomfort;Sore Pain Intervention(s): Limited activity within patient's tolerance;Monitored during session;Repositioned;RN gave pain meds during session;Patient requesting pain meds-RN notified  Home Living                                          Prior Functioning/Environment              Frequency  Min 2X/week        Progress Toward Goals  OT Goals(current goals can now be found in the care  plan section)  Progress towards OT goals: Not progressing toward goals - comment(may need to adjust goals)  Acute Rehab OT Goals Patient Stated Goal: none stated  Plan      Co-evaluation                 AM-PAC PT "6 Clicks" Daily Activity     Outcome Measure   Help from another person eating meals?: None Help from another person taking care of personal grooming?: A Little Help from another person toileting, which includes using toliet, bedpan, or urinal?: A Lot Help from another person bathing (including washing, rinsing, drying)?: A Lot Help from another person to put on and taking off regular upper body clothing?: A Little Help from another person to put on and taking off regular lower body clothing?: A Lot 6 Click Score: 16    End of Session    OT Visit Diagnosis: Unsteadiness on feet (R26.81);Other abnormalities of gait and mobility (R26.89);Muscle weakness (generalized) (M62.81);Pain   Activity Tolerance Patient tolerated treatment well   Patient Left in chair;with call bell/phone within reach;with nursing/sitter in room;with chair  alarm set   Nurse Communication          Time: [phone] OT Time Calculation (min): 13 min  Charges: OT General Charges $OT Visit: 1 Visit OT Treatments $Therapeutic Activity: 8-22 mins  Lesle Chris, OTR/L [phone] 10/23/2017   Monique Rush 10/23/2017, 9:24 AM

## 2017-10-24 ENCOUNTER — Other Ambulatory Visit: Payer: Self-pay

## 2017-10-24 ENCOUNTER — Non-Acute Institutional Stay (SKILLED_NURSING_FACILITY): Payer: Medicare Other | Admitting: Adult Health

## 2017-10-24 ENCOUNTER — Encounter: Payer: Self-pay | Admitting: Adult Health

## 2017-10-24 DIAGNOSIS — R531 Weakness: Secondary | ICD-10-CM

## 2017-10-24 DIAGNOSIS — E785 Hyperlipidemia, unspecified: Secondary | ICD-10-CM | POA: Diagnosis not present

## 2017-10-24 DIAGNOSIS — R918 Other nonspecific abnormal finding of lung field: Secondary | ICD-10-CM

## 2017-10-24 DIAGNOSIS — S22000A Wedge compression fracture of unspecified thoracic vertebra, initial encounter for closed fracture: Secondary | ICD-10-CM | POA: Diagnosis not present

## 2017-10-24 DIAGNOSIS — C799 Secondary malignant neoplasm of unspecified site: Secondary | ICD-10-CM

## 2017-10-24 DIAGNOSIS — K5909 Other constipation: Secondary | ICD-10-CM

## 2017-10-24 DIAGNOSIS — F0150 Vascular dementia without behavioral disturbance: Secondary | ICD-10-CM | POA: Diagnosis not present

## 2017-10-24 DIAGNOSIS — I10 Essential (primary) hypertension: Secondary | ICD-10-CM

## 2017-10-24 DIAGNOSIS — F0390 Unspecified dementia without behavioral disturbance: Secondary | ICD-10-CM

## 2017-10-24 MED ORDER — OXYCODONE HCL 5 MG PO TABS: 2.5000 mg | ORAL_TABLET | Freq: Four times a day (QID) | ORAL | 0 refills | Status: DC | PRN

## 2017-10-24 NOTE — Progress Notes (Signed)
Pt alert & oriented x1 (self). Respiration even and unlabored on 2L [place].  No c/o pain or discomfort.  Afrebrile. IV removed. D/C paperwork provided and explained. Advise that if she had a temp>100.4, persistant dizziness, light-headedness, nausea and/or vomiting to please return to the ED. PTar to transport pt.

## 2017-10-24 NOTE — Telephone Encounter (Signed)
Rx faxed to [facility] (P) [phone], (F) [phone] 

## 2017-10-24 NOTE — Progress Notes (Signed)
Location:   [facility] Room Number: 110 A Place of Service:  SNF (31)   CODE STATUS: DNR  Allergies  Allergen Reactions  . Morphine And Related Nausea And Vomiting  . Oxycontin [Oxycodone Hcl] Nausea And Vomiting  . Percocet [Oxycodone-Acetaminophen] Nausea And Vomiting    Chief Complaint  Patient presents with  . Hospitalization Follow-up    Hospital Follow up    HPI:  She is a 77 year old woman who has been hospitalized from 10-20-17 through 10-23-17 for weakness and malaise. She has history of dementia. She has suspected breast cancer with mets to liver bond and lung. More than likely this does represent a long term placement for her. She is unable to participate in the hpi or ros. There are no reports of uncontrolled; no fevers; no reports of agitation. She will continue to be followed for her chronic illnesses including; hypertension; dyslipidemia; dementia.   Past Medical History:  Diagnosis Date  . Encephalopathy   . Hypertension   . Insomnia   . Urine retention     Past Surgical History:  Procedure Laterality Date  . ANKLE SURGERY     Left  . CHOLECYSTECTOMY    . HIP ARTHROPLASTY Left 02/11/2014   Procedure: ARTHROPLASTY BIPOLAR HIP;  Surgeon: Mauri Pole, MD;  Location: [facility];  Service: Orthopedics;  Laterality: Left;    Social History   Socioeconomic History  . Marital status: Married    Spouse name: Not on file  . Number of children: 2  . Years of education: 32  . Highest education level: Not on file  Occupational History  . Occupation: Retired  Scientific laboratory technician  . Financial resource strain: Not on file  . Food insecurity:    Worry: Not on file    Inability: Not on file  . Transportation needs:    Medical: Not on file    Non-medical: Not on file  Tobacco Use  . Smoking status: Never Smoker  . Smokeless tobacco: Never Used  Substance and Sexual Activity  . Alcohol use: No    Alcohol/week: 0.0 standard drinks  . Drug use: No  .  Sexual activity: Never  Lifestyle  . Physical activity:    Days per week: Not on file    Minutes per session: Not on file  . Stress: Not on file  Relationships  . Social connections:    Talks on phone: Not on file    Gets together: Not on file    Attends religious service: Not on file    Active member of club or organization: Not on file    Attends meetings of clubs or organizations: Not on file    Relationship status: Not on file  . Intimate partner violence:    Fear of current or ex partner: Not on file    Emotionally abused: Not on file    Physically abused: Not on file    Forced sexual activity: Not on file  Other Topics Concern  . Not on file  Social History Narrative   Lives at home alone.  Son lives two blocks away.   Right-handed.   No caffeine use.   Family History  Problem Relation Age of Onset  . Alzheimer's disease Mother   . Heart disease Father       VITAL SIGNS BP 134/65 Comment: hospital readings  Pulse 100   Temp 97.8 F (36.6 C)   Resp (!) 21   Ht 5' 9" (1.753 m)  Wt 138 lb 7 oz (62.8 kg)   BMI 20.44 kg/m   Outpatient Encounter Medications as of 10/24/2017  Medication Sig  . acetaminophen (TYLENOL) 325 MG tablet Take 650 mg by mouth every 8 (eight) hours as needed (pain in left hip).  . Amino Acids-Protein Hydrolys (FEEDING SUPPLEMENT, PRO-STAT SUGAR FREE 64,) LIQD Take 30 mLs by mouth 2 (two) times daily.  Marland Kitchen amLODipine (NORVASC) 10 MG tablet Take 1 tablet (10 mg total) by mouth daily.  . diclofenac sodium (VOLTAREN) 1 % GEL Apply 2 g topically 4 (four) times daily.  Marland Kitchen donepezil (ARICEPT) 5 MG tablet Take 5 mg by mouth at bedtime.  . feeding supplement, ENSURE ENLIVE, (ENSURE ENLIVE) LIQD Take 237 mLs by mouth 2 (two) times daily between meals.  . magnesium hydroxide (MILK OF MAGNESIA) 400 MG/5ML suspension Take 30 mLs by mouth daily as needed for mild constipation.  . methocarbamol (ROBAXIN) 500 MG tablet Take 500 mg by mouth every 8 (eight)  hours as needed for muscle spasms.  . metoprolol succinate (TOPROL-XL) 50 MG 24 hr tablet Take 50 mg by mouth daily. Take with or immediately following a meal.  . mirtazapine (REMERON) 30 MG tablet Take 1 tablet (30 mg total) by mouth at bedtime.  Marland Kitchen oxyCODONE (OXY IR/ROXICODONE) 5 MG immediate release tablet Take 0.5 tablets (2.5 mg total) by mouth every 6 (six) hours as needed for moderate pain.  . polyethylene glycol (MIRALAX / GLYCOLAX) packet Take 17 g by mouth daily as needed.  . pravastatin (PRAVACHOL) 20 MG tablet Take 20 mg by mouth daily.  . QUEtiapine (SEROQUEL) 100 MG tablet Take 1 tablet (100 mg total) by mouth at bedtime. For sleep  . senna (SENOKOT) 8.6 MG TABS tablet Take 1 tablet (8.6 mg total) by mouth 2 (two) times daily.  . sertraline (ZOLOFT) 25 MG tablet Take 25 mg by mouth daily.  . [DISCONTINUED] polyethylene glycol (MIRALAX / GLYCOLAX) packet Take 17 g by mouth daily as needed. (Patient not taking: Reported on 10/24/2017)   No facility-administered encounter medications on file as of 10/24/2017.      SIGNIFICANT DIAGNOSTIC EXAMS  TODAY:   10-20-17:ct angio of chest;  1. No evidence of pulmonary embolism. 2. RIGHT axillary lymphadenopathy.  Index nodes are measured above. 3. Numerous BILATERAL lung nodules indicating metastatic disease. 4. I am suspicious that there is an incompletely imaged mass in the RIGHT breast which would account for the RIGHT axillary lymphadenopathy. Diagnostic mammography and possible ultrasound is recommended in further evaluation. 5. Small BILATERAL pleural effusions and associated mild passive atelectasis in the lower lobes. 6. Compression fractures involving T8, T10, T12 and L1. As there is sclerosis in each of these vertebral bodies, these may be pathologic and related to metastatic disease. Mixed lytic and sclerotic metastases are present in the sternum. 7. Moderate-sized hiatal hernia.  10-21-17: ct of abdomen and pelvis:  1. Motion  degraded examination. Hepatic masses consistent with metastatic disease, limited assessment by noncontrast CT. 2. Moderately atrophic RIGHT kidney with moderate hydroureteronephrosis and delayed function. 3. Multiple osseous metastasis and likely pathologic thoracolumbar fractures. 4. Redemonstration of suspicious RIGHT axillary lymphadenopathy with RIGHT breast skin thickening and potential mass. Recommend mammogram on non emergent basis. Aortic Atherosclerosis   LABS REVIEWED TODAY:   11-01-17: wbc 8.2; hgb 8.5; hct 27.3; mvc 89.8; plt 90; glucose 87l bun 18; creat 0.88; k+ 3.9 na++ 139; ast 57; alk phos 213; albumin 2.0   Review of Systems  Unable to perform ROS: Dementia (  unable to participate )    Physical Exam  Constitutional: No distress.  Frail   Neck: No thyromegaly present.  Cardiovascular: Normal rate, regular rhythm, normal heart sounds and intact distal pulses.  Pulmonary/Chest: Effort normal and breath sounds normal. No respiratory distress.  Abdominal: Soft. Bowel sounds are normal. She exhibits no distension. There is no tenderness.  Musculoskeletal: She exhibits no edema.  Is able to move extremities   Lymphadenopathy:    She has no cervical adenopathy.  Neurological:  Aware   Skin: Skin is warm and dry. She is not diaphoretic.  Psychiatric: She has a normal mood and affect.      ASSESSMENT/ PLAN:  TODAY:   1. Essential benign hypertension: is stable b/p 134/65: will continue norvasc 10 mg daily and toprol xl 50 mg daily   2. Dyslipidemia: is stable will continue pravachol 20 mg daily   3. Vascular dementia without behavioral disturbance: is without change weight is 138 pounds will continue aricept 5 mg daily   4. Psychosis in elderly: no reports of behavioral issues: will continue seroquel 100 mg nightly   5. Anxiety with depression: without change will continue remeron 30 mg nightly and zoloft 25 mg daily   6. Chronic constipation: is stable will  continue senna twice daily   7. Compression fracture of body of thoracic vertebra/ multiple lung nodule/metastatic cancer: is without change: will conitnue voltargen gel 2 gm four times daily; robaxin 500 mg every 8 hours as needed and oxycodon 2.5 mg every 6 hour as needed.       MD is aware of resident's narcotic use and is in agreement with current plan of care. We will attempt to wean resident as apropriate   Ok Edwards NP [facility] Adult Medicine  Contact [phone] Monday through Friday 8am- 5pm  After hours call [phone]

## 2017-10-25 ENCOUNTER — Encounter: Payer: Self-pay | Admitting: Adult Health

## 2017-10-25 ENCOUNTER — Non-Acute Institutional Stay (SKILLED_NURSING_FACILITY): Payer: Medicare Other | Admitting: Adult Health

## 2017-10-25 DIAGNOSIS — F0150 Vascular dementia without behavioral disturbance: Secondary | ICD-10-CM

## 2017-10-25 DIAGNOSIS — C799 Secondary malignant neoplasm of unspecified site: Secondary | ICD-10-CM | POA: Diagnosis not present

## 2017-10-25 NOTE — Progress Notes (Signed)
Location:   [facility] Room Number: 110 A Place of Service:  SNF (31)   CODE STATUS: DNR  Allergies  Allergen Reactions  . Morphine And Related Nausea And Vomiting  . Oxycontin [Oxycodone Hcl] Nausea And Vomiting  . Percocet [Oxycodone-Acetaminophen] Nausea And Vomiting    Chief Complaint  Patient presents with  . Acute Visit    Care Plan Meeting    HPI:  We have come together for her care plan meeting. She is unable to participate in the meeting. She does have family present. The focus of her care is for comfort only. They do not desire for any aggressive care. We have reviewed her medications; will stop the aricept and pravachol as these medications are not providing her with any benefit. Her family has agreed. They do desire for palliative care.   Past Medical History:  Diagnosis Date  . Encephalopathy   . Hypertension   . Insomnia   . Urine retention     Past Surgical History:  Procedure Laterality Date  . ANKLE SURGERY     Left  . CHOLECYSTECTOMY    . HIP ARTHROPLASTY Left 02/11/2014   Procedure: ARTHROPLASTY BIPOLAR HIP;  Surgeon: Mauri Pole, MD;  Location: [facility];  Service: Orthopedics;  Laterality: Left;    Social History   Socioeconomic History  . Marital status: Married    Spouse name: Not on file  . Number of children: 2  . Years of education: 69  . Highest education level: Not on file  Occupational History  . Occupation: Retired  Scientific laboratory technician  . Financial resource strain: Not on file  . Food insecurity:    Worry: Not on file    Inability: Not on file  . Transportation needs:    Medical: Not on file    Non-medical: Not on file  Tobacco Use  . Smoking status: Never Smoker  . Smokeless tobacco: Never Used  Substance and Sexual Activity  . Alcohol use: No    Alcohol/week: 0.0 standard drinks  . Drug use: No  . Sexual activity: Never  Lifestyle  . Physical activity:    Days per week: Not on file    Minutes per session:  Not on file  . Stress: Not on file  Relationships  . Social connections:    Talks on phone: Not on file    Gets together: Not on file    Attends religious service: Not on file    Active member of club or organization: Not on file    Attends meetings of clubs or organizations: Not on file    Relationship status: Not on file  . Intimate partner violence:    Fear of current or ex partner: Not on file    Emotionally abused: Not on file    Physically abused: Not on file    Forced sexual activity: Not on file  Other Topics Concern  . Not on file  Social History Narrative   Lives at home alone.  Son lives two blocks away.   Right-handed.   No caffeine use.   Family History  Problem Relation Age of Onset  . Alzheimer's disease Mother   . Heart disease Father       VITAL SIGNS BP 140/68   Pulse (!) 106   Temp 98.6 F (37 C)   Resp 20   Ht '5\' 9"'  (1.753 m)   Wt 135 lb 4.8 oz (61.4 kg)   SpO2 95%   BMI 19.98  kg/m   Outpatient Encounter Medications as of 10/25/2017  Medication Sig  . acetaminophen (TYLENOL) 325 MG tablet Take 650 mg by mouth every 8 (eight) hours as needed (pain in left hip).  . Amino Acids-Protein Hydrolys (FEEDING SUPPLEMENT, PRO-STAT SUGAR FREE 64,) LIQD Take 30 mLs by mouth 2 (two) times daily.  Marland Kitchen amLODipine (NORVASC) 10 MG tablet Take 1 tablet (10 mg total) by mouth daily.  . diclofenac sodium (VOLTAREN) 1 % GEL Apply 2 g topically 4 (four) times daily.  Marland Kitchen donepezil (ARICEPT) 5 MG tablet Take 5 mg by mouth at bedtime.  . feeding supplement, ENSURE ENLIVE, (ENSURE ENLIVE) LIQD Take 237 mLs by mouth 2 (two) times daily between meals.  . magnesium hydroxide (MILK OF MAGNESIA) 400 MG/5ML suspension Take 30 mLs by mouth daily as needed for mild constipation.  . methocarbamol (ROBAXIN) 500 MG tablet Take 500 mg by mouth every 8 (eight) hours as needed for muscle spasms.  . metoprolol succinate (TOPROL-XL) 50 MG 24 hr tablet Take 50 mg by mouth daily. Take with or  immediately following a meal.  . mirtazapine (REMERON) 30 MG tablet Take 1 tablet (30 mg total) by mouth at bedtime.  . Nutritional Supplements (NUTRITIONAL SUPPLEMENT PO) Regular Diet - Mechanical soft texture  . oxyCODONE (OXY IR/ROXICODONE) 5 MG immediate release tablet Take 0.5 tablets (2.5 mg total) by mouth every 6 (six) hours as needed for moderate pain.  . polyethylene glycol (MIRALAX / GLYCOLAX) packet Take 17 g by mouth daily as needed.  . pravastatin (PRAVACHOL) 20 MG tablet Take 20 mg by mouth daily.  . QUEtiapine (SEROQUEL) 100 MG tablet Take 1 tablet (100 mg total) by mouth at bedtime. For sleep  . senna (SENOKOT) 8.6 MG TABS tablet Take 1 tablet (8.6 mg total) by mouth 2 (two) times daily.  . sertraline (ZOLOFT) 25 MG tablet Take 25 mg by mouth daily.   No facility-administered encounter medications on file as of 10/25/2017.      SIGNIFICANT DIAGNOSTIC EXAMS   PREVIOUS:   10-20-17:ct angio of chest;  1. No evidence of pulmonary embolism. 2. RIGHT axillary lymphadenopathy.  Index nodes are measured above. 3. Numerous BILATERAL lung nodules indicating metastatic disease. 4. I am suspicious that there is an incompletely imaged mass in the RIGHT breast which would account for the RIGHT axillary lymphadenopathy. Diagnostic mammography and possible ultrasound is recommended in further evaluation. 5. Small BILATERAL pleural effusions and associated mild passive atelectasis in the lower lobes. 6. Compression fractures involving T8, T10, T12 and L1. As there is sclerosis in each of these vertebral bodies, these may be pathologic and related to metastatic disease. Mixed lytic and sclerotic metastases are present in the sternum. 7. Moderate-sized hiatal hernia.  10-21-17: ct of abdomen and pelvis:  1. Motion degraded examination. Hepatic masses consistent with metastatic disease, limited assessment by noncontrast CT. 2. Moderately atrophic RIGHT kidney with moderate hydroureteronephrosis  and delayed function. 3. Multiple osseous metastasis and likely pathologic thoracolumbar fractures. 4. Redemonstration of suspicious RIGHT axillary lymphadenopathy with RIGHT breast skin thickening and potential mass. Recommend mammogram on non emergent basis. Aortic Atherosclerosis   NO NEW EXAMS.   LABS REVIEWED PREVIOUS:   11-01-17: wbc 8.2; hgb 8.5; hct 27.3; mvc 89.8; plt 90; glucose 87l bun 18; creat 0.88; k+ 3.9 na++ 139; ast 57; alk phos 213; albumin 2.0  NO NEW LABS.    Review of Systems  Unable to perform ROS: Dementia (unable to participate )   Physical Exam  Constitutional:  No distress.  Frail   Neck: No thyromegaly present.  Cardiovascular: Normal rate, regular rhythm, normal heart sounds and intact distal pulses.  Pulmonary/Chest: Effort normal and breath sounds normal. No respiratory distress.  Abdominal: Soft. Bowel sounds are normal. She exhibits no distension. There is no tenderness.  Musculoskeletal: She exhibits no edema.  Lymphadenopathy:    She has no cervical adenopathy.  Neurological:  Aware   Skin: Skin is warm and dry. She is not diaphoretic.  Psychiatric: She has a normal mood and affect.    ASSESSMENT/ PLAN:  TODAY:   1. Metastatic cancer  2. 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    MD is aware of resident's narcotic use and is in agreement with current plan of care. We will attempt to wean resident as apropriate   Ok Edwards NP [facility]  Contact [phone] Monday through Friday 8am- 5pm  After hours call [phone]

## 2017-10-29 ENCOUNTER — Encounter: Payer: Self-pay | Admitting: Internal Medicine

## 2017-10-29 ENCOUNTER — Non-Acute Institutional Stay (SKILLED_NURSING_FACILITY): Payer: Medicare Other | Admitting: Internal Medicine

## 2017-10-29 ENCOUNTER — Other Ambulatory Visit: Payer: Self-pay

## 2017-10-29 DIAGNOSIS — M7989 Other specified soft tissue disorders: Secondary | ICD-10-CM | POA: Diagnosis not present

## 2017-10-29 DIAGNOSIS — S22000A Wedge compression fracture of unspecified thoracic vertebra, initial encounter for closed fracture: Secondary | ICD-10-CM

## 2017-10-29 DIAGNOSIS — R918 Other nonspecific abnormal finding of lung field: Secondary | ICD-10-CM

## 2017-10-29 DIAGNOSIS — N63 Unspecified lump in unspecified breast: Secondary | ICD-10-CM

## 2017-10-29 DIAGNOSIS — R5381 Other malaise: Secondary | ICD-10-CM

## 2017-10-29 DIAGNOSIS — E43 Unspecified severe protein-calorie malnutrition: Secondary | ICD-10-CM

## 2017-10-29 MED ORDER — OXYCODONE HCL 5 MG PO TABS: ORAL_TABLET | ORAL | 0 refills | Status: AC

## 2017-10-29 NOTE — Progress Notes (Addendum)
Patient ID: Monique Rush, female   DOB: 09-15-1940, 77 y.o.   MRN: [id number]   Provider:  DR Arletha Grippe Location:  [facility] Room Number: 105 A Place of Service:  SNF (31)  PCP: Benito Mccreedy, MD Patient Care Team: Benito Mccreedy, MD as PCP - General (Internal Medicine)  Extended Emergency Contact Information Primary Emergency Contact: Maryagnes Amos of [place] Phone: [phone] Relation: Son Secondary Emergency Contact: Judeen Hammans [place] of [place] Mobile Phone: [phone] Relation: Daughter  Code Status: DNR Goals of Care: Advanced Directive information Advanced Directives 10/29/2017  Does Patient Have a Medical Advance Directive? Yes  Type of Advance Directive Out of facility DNR (pink MOST or yellow form)  Does patient want to make changes to medical advance directive? No - Patient declined  Copy of [place] in Chart? -  Would patient like information on creating a medical advance directive? No - Patient declined  Pre-existing out of facility DNR order (yellow form or pink MOST form) Yellow form placed in chart (order not valid for inpatient use)      Chief Complaint  Patient presents with  . New Admit To SNF    Admission    HPI: Patient is a 77 y.o. female seen today for admission to SNF following hospital stay for symptomatic anemia, abdominal pain, fatigue, breast mass, multiple lung nodules, pressure injury of skin and dementia. Her D dimer was elevated and CTA chest --> right breast mass, multiple pathologic fx. Mets suspected in liver, bone, and lung. Palliative care consulted. Family has no plan for further w/u. She did receive 1 unit PRBCs prior to d/c. B12/folate nml; K 3.1-->3.9;  Cr 0.88; Hgb 7.7-->8.5; plts 136k-->90K; prealbumin 6.6; albumin 2; AST 57; ALT nml; alk phos 262 peak --> 213. She presents to SNF for short term rehab with potential for long term care.  Today she  reports fatigue and severe back pain. Pain improves with oxycodone but pain med not scheduled. Appetite poor. Sleep interrupted. She is a poor historian due to dementia. She was recently admitted for hypercalcemia and BP meds adjusted    Past Medical History:  Diagnosis Date  . Encephalopathy   . Hypertension   . Insomnia   . Urine retention    Past Surgical History:  Procedure Laterality Date  . ANKLE SURGERY     Left  . CHOLECYSTECTOMY    . HIP ARTHROPLASTY Left 02/11/2014   Procedure: ARTHROPLASTY BIPOLAR HIP;  Surgeon: Mauri Pole, MD;  Location: [facility];  Service: Orthopedics;  Laterality: Left;    reports that she has never smoked. She has never used smokeless tobacco. She reports that she does not drink alcohol or use drugs. Social History   Socioeconomic History  . Marital status: Married    Spouse name: Not on file  . Number of children: 2  . Years of education: 40  . Highest education level: Not on file  Occupational History  . Occupation: Retired  Scientific laboratory technician  . Financial resource strain: Not on file  . Food insecurity:    Worry: Not on file    Inability: Not on file  . Transportation needs:    Medical: Not on file    Non-medical: Not on file  Tobacco Use  . Smoking status: Never Smoker  . Smokeless tobacco: Never Used  Substance and Sexual Activity  . Alcohol use: No    Alcohol/week: 0.0 standard drinks  . Drug use:  No  . Sexual activity: Never  Lifestyle  . Physical activity:    Days per week: Not on file    Minutes per session: Not on file  . Stress: Not on file  Relationships  . Social connections:    Talks on phone: Not on file    Gets together: Not on file    Attends religious service: Not on file    Active member of club or organization: Not on file    Attends meetings of clubs or organizations: Not on file    Relationship status: Not on file  . Intimate partner violence:    Fear of current or ex partner: Not on file    Emotionally  abused: Not on file    Physically abused: Not on file    Forced sexual activity: Not on file  Other Topics Concern  . Not on file  Social History Narrative   Lives at home alone.  Son lives two blocks away.   Right-handed.   No caffeine use.    Functional Status Survey:    Family History  Problem Relation Age of Onset  . Alzheimer's disease Mother   . Heart disease Father     Health Maintenance  Topic Date Due  . INFLUENZA VACCINE  12/24/2017 (Originally 09/20/2017)  . DEXA SCAN  10/25/2018 (Originally 03/29/2005)  . TETANUS/TDAP  10/25/2018 (Originally 03/30/1959)  . PNA vac Low Risk Adult (2 of 2 - PCV13) 10/25/2018 (Originally 02/13/2015)    Allergies  Allergen Reactions  . Morphine And Related Nausea And Vomiting  . Oxycontin [Oxycodone Hcl] Nausea And Vomiting  . Percocet [Oxycodone-Acetaminophen] Nausea And Vomiting    Outpatient Encounter Medications as of 10/29/2017  Medication Sig  . acetaminophen (TYLENOL) 325 MG tablet Take 650 mg by mouth every 8 (eight) hours as needed (pain in left hip).  . Amino Acids-Protein Hydrolys (FEEDING SUPPLEMENT, PRO-STAT SUGAR FREE 64,) LIQD Take 30 mLs by mouth 2 (two) times daily.  Marland Kitchen amLODipine (NORVASC) 10 MG tablet Take 1 tablet (10 mg total) by mouth daily.  . diclofenac sodium (VOLTAREN) 1 % GEL Apply 2 g topically 4 (four) times daily.  . feeding supplement, ENSURE ENLIVE, (ENSURE ENLIVE) LIQD Take 237 mLs by mouth 2 (two) times daily between meals.  . magnesium hydroxide (MILK OF MAGNESIA) 400 MG/5ML suspension Take 30 mLs by mouth daily as needed for mild constipation.  . methocarbamol (ROBAXIN) 500 MG tablet Take 500 mg by mouth every 8 (eight) hours as needed for muscle spasms.  . metoprolol succinate (TOPROL-XL) 50 MG 24 hr tablet Take 50 mg by mouth daily. Take with or immediately following a meal.  . mirtazapine (REMERON) 30 MG tablet Take 1 tablet (30 mg total) by mouth at bedtime.  . Nutritional Supplements (NUTRITIONAL  SUPPLEMENT PO) Regular Diet - Mechanical soft texture  . oxyCODONE (OXY IR/ROXICODONE) 5 MG immediate release tablet Take 0.5 tablets (2.5 mg total) by mouth every 6 (six) hours as needed for moderate pain.  . OXYGEN Inhale 2 L/min into the lungs continuous.  . polyethylene glycol (MIRALAX / GLYCOLAX) packet Take 17 g by mouth daily as needed.  Marland Kitchen QUEtiapine (SEROQUEL) 100 MG tablet Take 1 tablet (100 mg total) by mouth at bedtime. For sleep  . senna (SENOKOT) 8.6 MG TABS tablet Take 1 tablet (8.6 mg total) by mouth 2 (two) times daily.  . sertraline (ZOLOFT) 25 MG tablet Take 25 mg by mouth daily.  . [DISCONTINUED] donepezil (ARICEPT) 5 MG tablet Take 5  mg by mouth at bedtime.  . [DISCONTINUED] pravastatin (PRAVACHOL) 20 MG tablet Take 20 mg by mouth daily.   No facility-administered encounter medications on file as of 10/29/2017.     Review of Systems  Unable to perform ROS: Dementia    Vitals:   10/29/17 0954  BP: 132/72  Pulse: (!) 114  Resp: (!) 22  Temp: 97.8 F (36.6 C)  SpO2: (!) 69%  Weight: 135 lb 4.8 oz (61.4 kg)  Height: '5\' 9"'  (1.753 m)   Body mass index is 19.98 kg/m. Physical Exam  Constitutional: She is oriented to person, place, and time. She appears well-developed and well-nourished.  HENT:  Mouth/Throat: Oropharynx is clear and moist. No oropharyngeal exudate.  MMM; no oral thrush  Eyes: Pupils are equal, round, and reactive to light. No scleral icterus.  Neck: Neck supple. Carotid bruit is not present. No tracheal deviation present. No thyromegaly present.  Cardiovascular: Normal rate, regular rhythm and intact distal pulses. Exam reveals no gallop and no friction rub.  Murmur (1/6 SEM) heard. No LE edema b/l. no calf TTP.   Pulmonary/Chest: Effort normal and breath sounds normal. No stridor. No respiratory distress. She has no wheezes. She has no rales.  Abdominal: Soft. Normal appearance and bowel sounds are normal. She exhibits no distension and no mass.  There is no hepatomegaly. There is tenderness. There is no rigidity, no rebound and no guarding. No hernia.  Musculoskeletal: She exhibits edema and tenderness.  Lymphadenopathy:    She has no cervical adenopathy.  Neurological: She is alert and oriented to person, place, and time. She has normal reflexes.  Skin: Skin is warm and dry. No rash noted.  Psychiatric: She has a normal mood and affect. Her behavior is normal. Judgment and thought content normal.    Labs reviewed: Basic Metabolic Panel: Recent Labs    09/26/17 0547  10/20/17 1031 10/20/17 2115 10/21/17 0716 10/22/17 0359  NA 141   < > 137  --  139 139  K 3.5   < > 3.1*  --  4.4 3.9  CL 99   < > 99  --  103 107  CO2 29   < > 24  --  24 24  GLUCOSE 106*   < > 100*  --  82 87  BUN 13   < > 19  --  14 18  CREATININE 0.95   < > 0.83  --  0.79 0.88  CALCIUM 10.6*   < > 9.1  --  9.2 9.2  MG 1.9  --   --  2.0 1.8  --   PHOS  --   --   --  3.9 3.7  --    < > = values in this interval not displayed.   Liver Function Tests: Recent Labs    10/20/17 1031 10/21/17 0716 10/22/17 0359  AST 58* 74* 57*  ALT '21 23 19  ' ALKPHOS 232* 262* 213*  BILITOT 0.7 1.1 0.6  PROT 5.8* 6.2* 5.3*  ALBUMIN 2.2* 2.4* 2.0*   No results for input(s): LIPASE, AMYLASE in the last 8760 hours. Recent Labs    09/26/17 1011  AMMONIA 10   CBC: Recent Labs    09/25/17 0949  10/20/17 1031 10/21/17 0716 10/22/17 0359  WBC 8.2   < > 10.5 10.0 8.2  NEUTROABS 5.7  --   --   --   --   HGB 9.7*   < > 7.2* 9.5* 8.5*  HCT 30.4*   < >  24.2* 31.1* 27.3*  MCV 87.4   < > 91.3 90.1 89.8  PLT 197   < > 136* 114* 90*   < > = values in this interval not displayed.   Cardiac Enzymes: No results for input(s): CKTOTAL, CKMB, CKMBINDEX, TROPONINI in the last 8760 hours. BNP: Invalid input(s): POCBNP No results found for: HGBA1C Lab Results  Component Value Date   TSH 2.907 10/21/2017   Lab Results  Component Value Date   VITAMINB12 793 10/20/2017     Lab Results  Component Value Date   FOLATE 8.9 10/20/2017   Lab Results  Component Value Date   IRON 61 10/20/2017   TIBC 263 10/20/2017   FERRITIN 406 (H) 10/20/2017    Imaging and Procedures obtained prior to SNF admission: Ct Abdomen Pelvis Wo Contrast  Result Date: 10/21/2017 CLINICAL DATA:  Hematuria. EXAM: CT ABDOMEN AND PELVIS WITHOUT CONTRAST TECHNIQUE: Multidetector CT imaging of the abdomen and pelvis was performed following the standard protocol without IV contrast. COMPARISON:  CT abdomen and pelvis February 20, 2014 and abdominal radiograph October 20, 2017. FINDINGS: Moderate motion degraded examination. LOWER CHEST: Small pleural effusions and atelectasis. HEPATOBILIARY: Numerous hypodense masses throughout the liver difficult to quantify by noncontrast CT. Status post cholecystectomy. PANCREAS: Atrophic, nonacute. SPLEEN: Normal. ADRENALS/URINARY TRACT: Kidneys are orthotopic, demonstrating normal size and morphology. Contrast excretion limits sensitivity for small nonobstructing nephrolithiasis. Mild RIGHT hydroureteronephrosis and moderately atrophic RIGHT kidney. Delayed RIGHT contrast excretion. Limited assessment for renal mass by noncontrast CT. Contrast in the urinary bladder. Normal adrenal glands. STOMACH/BOWEL: The stomach, small and large bowel are normal in course and caliber without inflammatory changes. Small and large bowel air-fluid levels. 8.3 cm stool distended rectum with mild perirectal fat stranding. Normal appendix. VASCULAR/LYMPHATIC: Aortoiliac vessels are normal in course and caliber. Severe calcific atherosclerosis. Included chest demonstrates RIGHT axillary lymphadenopathy measuring to 15 mm short axis with pericapsular inflammation. REPRODUCTIVE: Normal. OTHER: No intraperitoneal free fluid or free air. MUSCULOSKELETAL: Destructive 2.6 cm sternal mass better seen on yesterday's CT chest. New moderate L1 and mild L2 fractures with underlying sclerosis and  irregularity. Severe T12, moderate T10 and moderate T8 fractures. Abnormal RIGHT breast skin thickening and asymmetric densities. Streak artifact from LEFT hip arthroplasty. IMPRESSION: 1. Motion degraded examination. Hepatic masses consistent with metastatic disease, limited assessment by noncontrast CT. 2. Moderately atrophic RIGHT kidney with moderate hydroureteronephrosis and delayed function. 3. Multiple osseous metastasis and likely pathologic thoracolumbar fractures. 4. Redemonstration of suspicious RIGHT axillary lymphadenopathy with RIGHT breast skin thickening and potential mass. Recommend mammogram on non emergent basis. Aortic Atherosclerosis (ICD10-I70.0). Electronically Signed   By: Elon Alas M.D.   On: 10/21/2017 05:20   Dg Chest 2 View  Result Date: 10/20/2017 CLINICAL DATA:  Increasing lethargy over the past 2 days. EXAM: CHEST - 2 VIEW COMPARISON:  PA and lateral chest 09/25/2017 and 02/10/2014. FINDINGS: Lung volumes are somewhat low but the lungs are clear. Heart size is normal. No pneumothorax or pleural fluid. Remote compression fracture at the thoracolumbar junction is noted. No acute bony abnormality. IMPRESSION: No acute disease. Electronically Signed   By: Inge Rise M.D.   On: 10/20/2017 11:14   Dg Abd 1 View  Result Date: 10/20/2017 CLINICAL DATA:  Abdomen pain EXAM: ABDOMEN - 1 VIEW COMPARISON:  09/29/2017 FINDINGS: Normal bowel gas pattern. Left hip hemiarthroplasty. Moderate dextroscoliosis. No acute skeletal abnormality Contrast in the urinary bladder from recent CT chest. Bladder diverticulum on the left. Multiple right renal calculi as noted  on prior CT. There is also some contrast in the right renal pelvis which appears mildly dilated. Incomplete evaluation. IMPRESSION: Normal bowel gas pattern Multiple right renal calculi with mild dilatation of the right renal pelvis. Electronically Signed   By: Franchot Gallo M.D.   On: 10/20/2017 21:31   Ct Angio Chest  Pe W And/or Wo Contrast  Result Date: 10/20/2017 CLINICAL DATA:  Hypoxemia. Elevated D-dimer. BILATERAL UPPER and LOWER extremity edema. EXAM: CT ANGIOGRAPHY CHEST WITH CONTRAST TECHNIQUE: Multidetector CT imaging of the chest was performed using the standard protocol during bolus administration of intravenous contrast. Multiplanar CT image reconstructions and MIPs were obtained to evaluate the vascular anatomy. CONTRAST:  41m ISOVUE-370 IOPAMIDOL INJECTION 76% IV. COMPARISON:  None. FINDINGS: Beam hardening streak artifact is present as the patient was unable to raise the LEFT arm. Cardiovascular: Contrast opacification of pulmonary arteries is very good. No filling defects within either main pulmonary artery or their segmental branches in either lung to suggest pulmonary embolism. Heart mildly enlarged. LEFT ventricular hypertrophy. Moderate to severe LAD and RIGHT coronary artery atherosclerosis. No pericardial effusion. Moderate to severe atherosclerosis involving the thoracic and proximal abdominal aorta with calcified and noncalcified plaque. No evidence of aortic aneurysm. Mediastinum/Nodes: 2 adjacent enlarged RIGHT axillary lymph nodes measuring approximately 2.7 x 1.4 cm (series 5, image 73) and 2.3 x 1.5 cm (image 54). No pathologic LEFT axillary, mediastinal or hilar lymphadenopathy. Moderate-sized hiatal hernia. Normal appearing esophagus. Approximate 1.2 cm LEFT lobe thyroid nodule. Lungs/Pleura: Numerous BILATERAL lung nodules. Focal bronchiectasis involving the LEFT UPPER LOBE and the SUPERIOR segment LEFT LOWER LOBE. Emphysematous changes in both lungs. No confluent airspace consolidation. Small BILATERAL pleural effusions and associated mild passive atelectasis in the lower lobes. Central airways patent with marked bronchial wall thickening. Upper Abdomen: Contrast is present in the visualized renal collecting systems likely related to an earlier test injection. Visualized upper abdomen  unremarkable for the early arterial phase of enhancement. Musculoskeletal: Thoracolumbar levoscoliosis. Multiple compression fractures which are not likely acute, including T8, T10, T12, and L1. Sclerosis is present in each of these vertebral bodies. Mixed lytic and sclerotic lesions are present in the sternum. Other: I am suspicious that there is an incompletely imaged mass in the RIGHT breast (series 5, images 122-125). Review of the MIP images confirms the above findings. IMPRESSION: 1. No evidence of pulmonary embolism. 2. RIGHT axillary lymphadenopathy.  Index nodes are measured above. 3. Numerous BILATERAL lung nodules indicating metastatic disease. 4. I am suspicious that there is an incompletely imaged mass in the RIGHT breast which would account for the RIGHT axillary lymphadenopathy. Diagnostic mammography and possible ultrasound is recommended in further evaluation. 5. Small BILATERAL pleural effusions and associated mild passive atelectasis in the lower lobes. 6. Compression fractures involving T8, T10, T12 and L1. As there is sclerosis in each of these vertebral bodies, these may be pathologic and related to metastatic disease. Mixed lytic and sclerotic metastases are present in the sternum. 7. Moderate-sized hiatal hernia. Aortic Atherosclerosis (ICD10-I70.0) and Emphysema (ICD10-J43.9). Electronically Signed   By: TEvangeline DakinM.D.   On: 10/20/2017 17:15    Assessment/Plan   ICD-10-CM   1. Calf swelling M79.89    right with increased warmth to chart  2. Compression fracture of body of thoracic vertebra (HCC) S22.000A   3. Multiple lung nodules R91.8   4. Breast mass N63.0    right  5. Physical deconditioning R53.81   6. Severe protein-calorie malnutrition ([place] E43    CHANGE  OXYCODONE 5MS TO 0.5 TAB PO TID FOR CANCER RELATED PAIN. MAY TAKE ADDITIONAL 0.5 TAB Q6HRS PRN BREAKTHROUGH PAIN - HOLD FOR SEDATION  Cont other meds as ordered  PT/OT/ST as tolerated  F/u with specialists  as indicated  Cont nutritional supplements as ordered  Wound care as ordered  GOAL: short term rehab and d/c home when medically appropriate. Communicated with pt and nursing.  Will follow  Labs/tests ordered: RLE venous doppler US to r/o DVT    Dezra Mandella S. Perlie Gold  [facility] [address] [place], [place] [address] [phone] Cell (Monday-Friday 8 AM - 5 PM) [phone] After 5 PM and follow prompts

## 2017-10-31 ENCOUNTER — Non-Acute Institutional Stay (SKILLED_NURSING_FACILITY): Payer: Medicare Other | Admitting: Adult Health

## 2017-10-31 ENCOUNTER — Encounter: Payer: Self-pay | Admitting: Adult Health

## 2017-10-31 DIAGNOSIS — E785 Hyperlipidemia, unspecified: Secondary | ICD-10-CM | POA: Diagnosis not present

## 2017-10-31 DIAGNOSIS — F0150 Vascular dementia without behavioral disturbance: Secondary | ICD-10-CM | POA: Diagnosis not present

## 2017-10-31 DIAGNOSIS — I10 Essential (primary) hypertension: Secondary | ICD-10-CM | POA: Diagnosis not present

## 2017-10-31 NOTE — Progress Notes (Signed)
Location:   [facility] Room Number: 105 A Place of Service:  SNF (31)   CODE STATUS: DNR  Allergies  Allergen Reactions  . Morphine And Related Nausea And Vomiting  . Oxycontin [Oxycodone Hcl] Nausea And Vomiting  . Percocet [Oxycodone-Acetaminophen] Nausea And Vomiting    Chief Complaint  Patient presents with  . Medical Management of Chronic Issues    Hypertension; dementia; dyslipidemia. Weekly follow up for the first 30 days post hospitalization     HPI:  She is a 77 year old long term resident of this facility being seen for the management of her chronic illnesses: hypertension; dementia; dyslipidemia. She is unable to participate in the hpi or ros. There are no reports of uncontrolled pain; no signs of anxiety or agitation present. There are no nursing concerns at this time.   Past Medical History:  Diagnosis Date  . Encephalopathy   . Hypertension   . Insomnia   . Urine retention     Past Surgical History:  Procedure Laterality Date  . ANKLE SURGERY     Left  . CHOLECYSTECTOMY    . HIP ARTHROPLASTY Left 02/11/2014   Procedure: ARTHROPLASTY BIPOLAR HIP;  Surgeon: Mauri Pole, MD;  Location: [place];  Service: Orthopedics;  Laterality: Left;    Social History   Socioeconomic History  . Marital status: Married    Spouse name: Not on file  . Number of children: 2  . Years of education: 12  . Highest education level: Not on file  Occupational History  . Occupation: Retired  Scientific laboratory technician  . Financial resource strain: Not on file  . Food insecurity:    Worry: Not on file    Inability: Not on file  . Transportation needs:    Medical: Not on file    Non-medical: Not on file  Tobacco Use  . Smoking status: Never Smoker  . Smokeless tobacco: Never Used  Substance and Sexual Activity  . Alcohol use: No    Alcohol/week: 0.0 standard drinks  . Drug use: No  . Sexual activity: Never  Lifestyle  . Physical activity:    Days per week: Not  on file    Minutes per session: Not on file  . Stress: Not on file  Relationships  . Social connections:    Talks on phone: Not on file    Gets together: Not on file    Attends religious service: Not on file    Active member of club or organization: Not on file    Attends meetings of clubs or organizations: Not on file    Relationship status: Not on file  . Intimate partner violence:    Fear of current or ex partner: Not on file    Emotionally abused: Not on file    Physically abused: Not on file    Forced sexual activity: Not on file  Other Topics Concern  . Not on file  Social History Narrative   Lives at home alone.  Son lives two blocks away.   Right-handed.   No caffeine use.   Family History  Problem Relation Age of Onset  . Alzheimer's disease Mother   . Heart disease Father       VITAL SIGNS BP 137/72   Pulse (!) 114   Temp 97.8 F (36.6 C)   Resp (!) 22   Ht '5\' 9"'  (1.753 m)   Wt 135 lb 4.8 oz (61.4 kg)   SpO2 90%   BMI  19.98 kg/m   Outpatient Encounter Medications as of 10/31/2017  Medication Sig  . acetaminophen (TYLENOL) 325 MG tablet Take 650 mg by mouth every 8 (eight) hours as needed (pain in left hip).  . Amino Acids-Protein Hydrolys (FEEDING SUPPLEMENT, PRO-STAT SUGAR FREE 64,) LIQD Take 30 mLs by mouth 2 (two) times daily.  Marland Kitchen amLODipine (NORVASC) 10 MG tablet Take 1 tablet (10 mg total) by mouth daily.  . diclofenac sodium (VOLTAREN) 1 % GEL Apply 2 g topically 4 (four) times daily.  . feeding supplement, ENSURE ENLIVE, (ENSURE ENLIVE) LIQD Take 237 mLs by mouth 2 (two) times daily between meals.  . magnesium hydroxide (MILK OF MAGNESIA) 400 MG/5ML suspension Take 30 mLs by mouth daily as needed for mild constipation.  . methocarbamol (ROBAXIN) 500 MG tablet Take 500 mg by mouth every 8 (eight) hours as needed for muscle spasms.  . metoprolol succinate (TOPROL-XL) 50 MG 24 hr tablet Take 50 mg by mouth daily. Take with or immediately following a  meal.  . mirtazapine (REMERON) 30 MG tablet Take 1 tablet (30 mg total) by mouth at bedtime.  . Nutritional Supplements (NUTRITIONAL SUPPLEMENT PO) Regular Diet - Mechanical soft texture  . oxyCODONE (ROXICODONE) 5 MG immediate release tablet Give 0.5 tablet by mouth three times daily for cancer related pain.  May give additional 0.5 tablet by mouth every 6 hours as needed for breakthrough pain.  Hold for sedation  . OXYGEN Inhale 2 L/min into the lungs continuous.  . polyethylene glycol (MIRALAX / GLYCOLAX) packet Take 17 g by mouth daily as needed.  Marland Kitchen QUEtiapine (SEROQUEL) 100 MG tablet Take 1 tablet (100 mg total) by mouth at bedtime. For sleep  . senna (SENOKOT) 8.6 MG TABS tablet Take 1 tablet (8.6 mg total) by mouth 2 (two) times daily.  . sertraline (ZOLOFT) 25 MG tablet Take 25 mg by mouth daily.   No facility-administered encounter medications on file as of 10/31/2017.      SIGNIFICANT DIAGNOSTIC EXAMS   PREVIOUS:   10-20-17:ct angio of chest;  1. No evidence of pulmonary embolism. 2. RIGHT axillary lymphadenopathy.  Index nodes are measured above. 3. Numerous BILATERAL lung nodules indicating metastatic disease. 4. I am suspicious that there is an incompletely imaged mass in the RIGHT breast which would account for the RIGHT axillary lymphadenopathy. Diagnostic mammography and possible ultrasound is recommended in further evaluation. 5. Small BILATERAL pleural effusions and associated mild passive atelectasis in the lower lobes. 6. Compression fractures involving T8, T10, T12 and L1. As there is sclerosis in each of these vertebral bodies, these may be pathologic and related to metastatic disease. Mixed lytic and sclerotic metastases are present in the sternum. 7. Moderate-sized hiatal hernia.  10-21-17: ct of abdomen and pelvis:  1. Motion degraded examination. Hepatic masses consistent with metastatic disease, limited assessment by noncontrast CT. 2. Moderately atrophic RIGHT  kidney with moderate hydroureteronephrosis and delayed function. 3. Multiple osseous metastasis and likely pathologic thoracolumbar fractures. 4. Redemonstration of suspicious RIGHT axillary lymphadenopathy with RIGHT breast skin thickening and potential mass. Recommend mammogram on non emergent basis. Aortic Atherosclerosis   NO NEW EXAMS.   LABS REVIEWED PREVIOUS:   11-01-17: wbc 8.2; hgb 8.5; hct 27.3; mvc 89.8; plt 90; glucose 87l bun 18; creat 0.88; k+ 3.9 na++ 139; ast 57; alk phos 213; albumin 2.0  NO NEW LABS.    Review of Systems  Unable to perform ROS: Dementia (unable to participate )   Physical Exam  Constitutional: No distress.  Frail   Neck: No thyromegaly present.  Cardiovascular: Regular rhythm, normal heart sounds and intact distal pulses.  Is tachycardic   Pulmonary/Chest: Effort normal and breath sounds normal. No respiratory distress.  Abdominal: Soft. Bowel sounds are normal. She exhibits no distension. There is no tenderness.  Musculoskeletal: She exhibits no edema.  Lymphadenopathy:    She has no cervical adenopathy.  Neurological:  Aware   Skin: Skin is warm and dry. She is not diaphoretic.  Psychiatric: She has a normal mood and affect.      ASSESSMENT/ PLAN:   TODAY:   1. Essential benign hypertension: is stable b/p 137/72: will continue norvasc 10 mg daily and toprol xl 50 mg daily   2. Dyslipidemia: is stable is off pravachol   3. Vascular dementia without behavioral disturbance: is without change weight is 135 pounds is off medications will monitor  PREVIOUS  4. Psychosis in elderly: no reports of behavioral issues: will continue seroquel 100 mg nightly   5. Anxiety with depression: without change will continue remeron 30 mg nightly and zoloft 25 mg daily   6. Chronic constipation: is stable will continue senna twice daily   7. Compression fracture of body of thoracic vertebra/ multiple lung nodule/metastatic cancer: is without change:  will conitnue voltargen gel 2 gm four times daily; robaxin 500 mg every 8 hours as needed and oxycodon 2.5 mg every 6 hour as needed.       MD is aware of resident's narcotic use and is in agreement with current plan of care. We will attempt to wean resident as apropriate   Ok Edwards NP [facility]  Contact [phone] Monday through Friday 8am- 5pm  After hours call [phone]

## 2017-11-07 ENCOUNTER — Other Ambulatory Visit: Payer: Self-pay

## 2017-11-07 ENCOUNTER — Encounter: Payer: Self-pay | Admitting: Adult Health

## 2017-11-07 ENCOUNTER — Non-Acute Institutional Stay (SKILLED_NURSING_FACILITY): Payer: Medicare Other | Admitting: Adult Health

## 2017-11-07 DIAGNOSIS — S22000A Wedge compression fracture of unspecified thoracic vertebra, initial encounter for closed fracture: Secondary | ICD-10-CM

## 2017-11-07 DIAGNOSIS — C799 Secondary malignant neoplasm of unspecified site: Secondary | ICD-10-CM | POA: Diagnosis not present

## 2017-11-07 MED ORDER — MORPHINE SULFATE (CONCENTRATE) 20 MG/ML PO SOLN: ORAL | 0 refills | Status: AC

## 2017-11-07 NOTE — Telephone Encounter (Signed)
Rx faxed to [facility] (P) [phone], (F) [phone] 

## 2017-11-07 NOTE — Progress Notes (Signed)
Location:   [facility] Room Number: 105 A Place of Service:  SNF (31)   CODE STATUS: DNR  Allergies  Allergen Reactions  . Morphine And Related Nausea And Vomiting  . Oxycontin [Oxycodone Hcl] Nausea And Vomiting  . Percocet [Oxycodone-Acetaminophen] Nausea And Vomiting    Chief Complaint  Patient presents with  . Acute Visit    Change in Status    HPI:  She has had a decline in her status. The nursing staff has asked to change her pain medication to roxanol as she is unable to take medications easily. She is unable to participate in the hpi or ros. There are no reports of fevers; no signs of agitation present.   Past Medical History:  Diagnosis Date  . Encephalopathy   . Hypertension   . Insomnia   . Urine retention     Past Surgical History:  Procedure Laterality Date  . ANKLE SURGERY     Left  . CHOLECYSTECTOMY    . HIP ARTHROPLASTY Left 02/11/2014   Procedure: ARTHROPLASTY BIPOLAR HIP;  Surgeon: Mauri Pole, MD;  Location: [place];  Service: Orthopedics;  Laterality: Left;    Social History   Socioeconomic History  . Marital status: Married    Spouse name: Not on file  . Number of children: 2  . Years of education: 45  . Highest education level: Not on file  Occupational History  . Occupation: Retired  Scientific laboratory technician  . Financial resource strain: Not on file  . Food insecurity:    Worry: Not on file    Inability: Not on file  . Transportation needs:    Medical: Not on file    Non-medical: Not on file  Tobacco Use  . Smoking status: Never Smoker  . Smokeless tobacco: Never Used  Substance and Sexual Activity  . Alcohol use: No    Alcohol/week: 0.0 standard drinks  . Drug use: No  . Sexual activity: Never  Lifestyle  . Physical activity:    Days per week: Not on file    Minutes per session: Not on file  . Stress: Not on file  Relationships  . Social connections:    Talks on phone: Not on file    Gets together: Not on file      Attends religious service: Not on file    Active member of club or organization: Not on file    Attends meetings of clubs or organizations: Not on file    Relationship status: Not on file  . Intimate partner violence:    Fear of current or ex partner: Not on file    Emotionally abused: Not on file    Physically abused: Not on file    Forced sexual activity: Not on file  Other Topics Concern  . Not on file  Social History Narrative   Lives at home alone.  Son lives two blocks away.   Right-handed.   No caffeine use.   Family History  Problem Relation Age of Onset  . Alzheimer's disease Mother   . Heart disease Father       VITAL SIGNS BP 117/72   Pulse (!) 120   Temp (!) 96.9 F (36.1 C)   Resp (!) 22   Ht 5' 9" (1.753 m)   Wt 135 lb 4.8 oz (61.4 kg)   SpO2 90%   BMI 19.98 kg/m   Outpatient Encounter Medications as of 11/07/2017  Medication Sig  . acetaminophen (TYLENOL) 325 MG  tablet Take 650 mg by mouth every 8 (eight) hours as needed (pain in left hip).  . Amino Acids-Protein Hydrolys (FEEDING SUPPLEMENT, PRO-STAT SUGAR FREE 64,) LIQD Take 30 mLs by mouth 2 (two) times daily.  Marland Kitchen amLODipine (NORVASC) 10 MG tablet Take 1 tablet (10 mg total) by mouth daily.  . diclofenac sodium (VOLTAREN) 1 % GEL Apply 2 g topically 4 (four) times daily.  . feeding supplement, ENSURE ENLIVE, (ENSURE ENLIVE) LIQD Take 237 mLs by mouth 2 (two) times daily between meals.  Marland Kitchen LORazepam (ATIVAN) 0.5 MG tablet Take 0.5 mg by mouth every 4 (four) hours as needed for anxiety. X 14 days  . magnesium hydroxide (MILK OF MAGNESIA) 400 MG/5ML suspension Take 30 mLs by mouth daily as needed for mild constipation.  . methocarbamol (ROBAXIN) 500 MG tablet Take 500 mg by mouth every 8 (eight) hours as needed for muscle spasms.  . metoprolol succinate (TOPROL-XL) 50 MG 24 hr tablet Take 50 mg by mouth daily. Take with or immediately following a meal.  . mirtazapine (REMERON) 30 MG tablet Take 1 tablet  (30 mg total) by mouth at bedtime.  Marland Kitchen morphine (ROXANOL) 20 MG/ML concentrated solution Take by mouth every 2 (two) hours as needed for severe pain. Give 5 mg (.25 ml) by mouth every 1 hours as needed for pain  . Nutritional Supplements (NUTRITIONAL SUPPLEMENT PO) Regular Diet - Mechanical soft texture  . oxyCODONE (ROXICODONE) 5 MG immediate release tablet Give 0.5 tablet by mouth three times daily for cancer related pain.  May give additional 0.5 tablet by mouth every 6 hours as needed for breakthrough pain.  Hold for sedation  . OXYGEN Inhale 2 L/min into the lungs continuous.  . polyethylene glycol (MIRALAX / GLYCOLAX) packet Take 17 g by mouth daily as needed.  Marland Kitchen QUEtiapine (SEROQUEL) 100 MG tablet Take 1 tablet (100 mg total) by mouth at bedtime. For sleep  . senna (SENOKOT) 8.6 MG TABS tablet Take 1 tablet (8.6 mg total) by mouth 2 (two) times daily.  . sertraline (ZOLOFT) 25 MG tablet Take 25 mg by mouth daily.   No facility-administered encounter medications on file as of 11/07/2017.      SIGNIFICANT DIAGNOSTIC EXAMS   PREVIOUS:   10-20-17:ct angio of chest;  1. No evidence of pulmonary embolism. 2. RIGHT axillary lymphadenopathy.  Index nodes are measured above. 3. Numerous BILATERAL lung nodules indicating metastatic disease. 4. I am suspicious that there is an incompletely imaged mass in the RIGHT breast which would account for the RIGHT axillary lymphadenopathy. Diagnostic mammography and possible ultrasound is recommended in further evaluation. 5. Small BILATERAL pleural effusions and associated mild passive atelectasis in the lower lobes. 6. Compression fractures involving T8, T10, T12 and L1. As there is sclerosis in each of these vertebral bodies, these may be pathologic and related to metastatic disease. Mixed lytic and sclerotic metastases are present in the sternum. 7. Moderate-sized hiatal hernia.  10-21-17: ct of abdomen and pelvis:  1. Motion degraded examination.  Hepatic masses consistent with metastatic disease, limited assessment by noncontrast CT. 2. Moderately atrophic RIGHT kidney with moderate hydroureteronephrosis and delayed function. 3. Multiple osseous metastasis and likely pathologic thoracolumbar fractures. 4. Redemonstration of suspicious RIGHT axillary lymphadenopathy with RIGHT breast skin thickening and potential mass. Recommend mammogram on non emergent basis. Aortic Atherosclerosis   NO NEW EXAMS.   LABS REVIEWED PREVIOUS:   11-01-17: wbc 8.2; hgb 8.5; hct 27.3; mvc 89.8; plt 90; glucose 87l bun 18; creat 0.88; k+  3.9 na++ 139; ast 57; alk phos 213; albumin 2.0  NO NEW LABS.     Review of Systems  Unable to perform ROS: Dementia (unable to participate )   Physical Exam  Constitutional: No distress.  Frail   Neck: No thyromegaly present.  Cardiovascular: Regular rhythm, normal heart sounds and intact distal pulses.  Is tachycardic   Pulmonary/Chest: Effort normal and breath sounds normal. No respiratory distress.  Abdominal: Soft. Bowel sounds are normal. She exhibits no distension. There is no tenderness.  Musculoskeletal: She exhibits no edema.  Lymphadenopathy:    She has no cervical adenopathy.  Neurological:  Aware   Skin: Skin is warm and dry. She is not diaphoretic.  Psychiatric: She has a normal mood and affect.     ASSESSMENT/ PLAN:  TODAY:   1. Compression fracture of body of thoracic vertebra 2. Metastatic cancer  Will stop oxycodone Will begin roxonal 5 mg every 6 hours routinely and every 1 hour as needed.   MD is aware of resident's narcotic use and is in agreement with current plan of care. We will attempt to wean resident as apropriate   Ok Edwards NP [facility]  Contact [phone] Monday through Friday 8am- 5pm  After hours call [phone]

## 2017-11-10 DIAGNOSIS — I10 Essential (primary) hypertension: Secondary | ICD-10-CM | POA: Insufficient documentation

## 2017-11-10 DIAGNOSIS — F0150 Vascular dementia without behavioral disturbance: Secondary | ICD-10-CM | POA: Insufficient documentation

## 2017-11-10 DIAGNOSIS — F0390 Unspecified dementia without behavioral disturbance: Secondary | ICD-10-CM | POA: Insufficient documentation

## 2017-11-10 DIAGNOSIS — C799 Secondary malignant neoplasm of unspecified site: Secondary | ICD-10-CM | POA: Insufficient documentation

## 2017-11-10 DIAGNOSIS — K5909 Other constipation: Secondary | ICD-10-CM | POA: Insufficient documentation

## 2017-11-10 DIAGNOSIS — E785 Hyperlipidemia, unspecified: Secondary | ICD-10-CM | POA: Insufficient documentation

## 2017-11-20 DEATH — deceased

## 2020-03-17 IMAGING — CT CT HEAD W/O CM
3 series · 16 of 47 positions shown, 19 images · non-contrast
Comparison: 02/12/2014

CLINICAL DATA: Confusion

EXAM:
CT HEAD WITHOUT CONTRAST
TECHNIQUE: Contiguous axial images were obtained from the base of the skull
through the vertex without intravenous contrast.

[Series 2: head wo · axial · 0.41mm/px · z∈[-139,-9]mm · 10 of 32 slices shown, 13 images]
[im 3/32  brain]
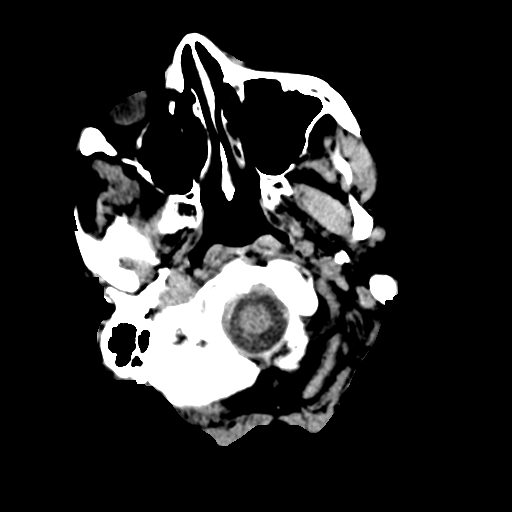
[im 3/32  bone]
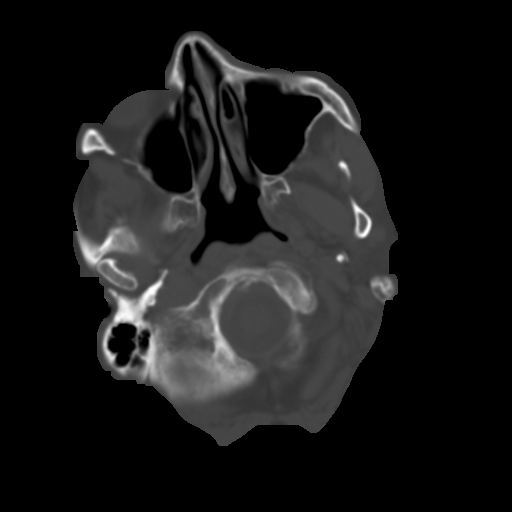
[im 6/32  brain]
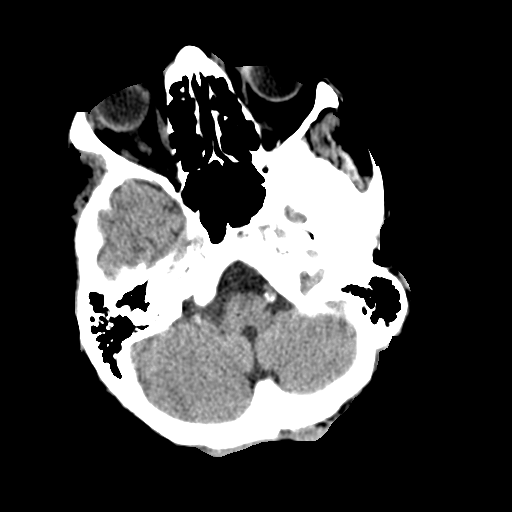
[im 9/32  brain]
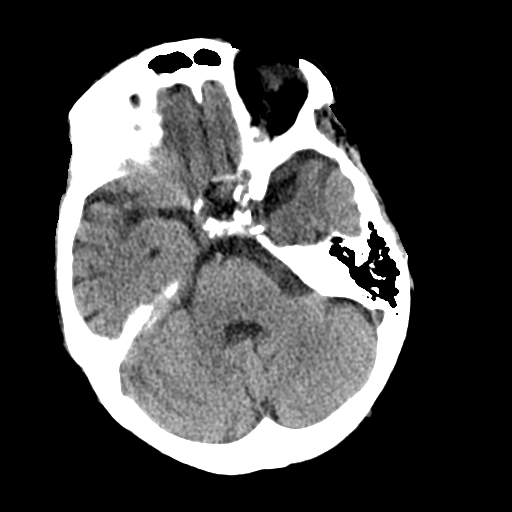
[im 11/32  brain]
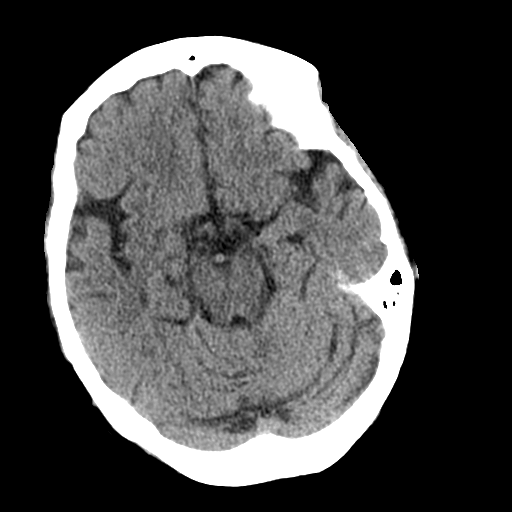
[im 14/32  brain]
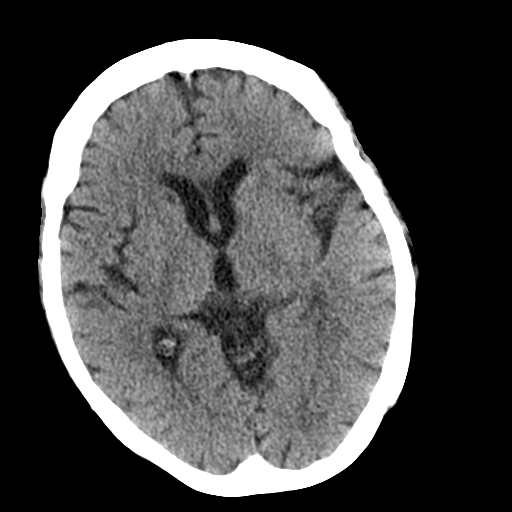
[im 14/32  bone]
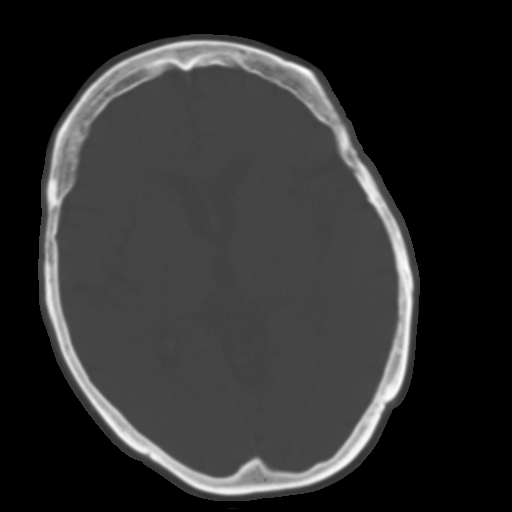
[im 18/32  brain]
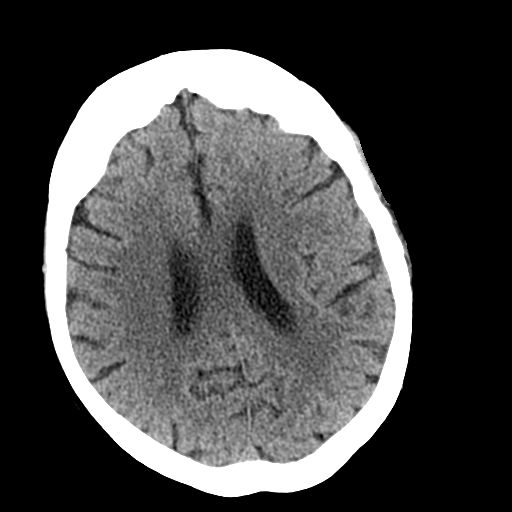
[im 21/32  brain]
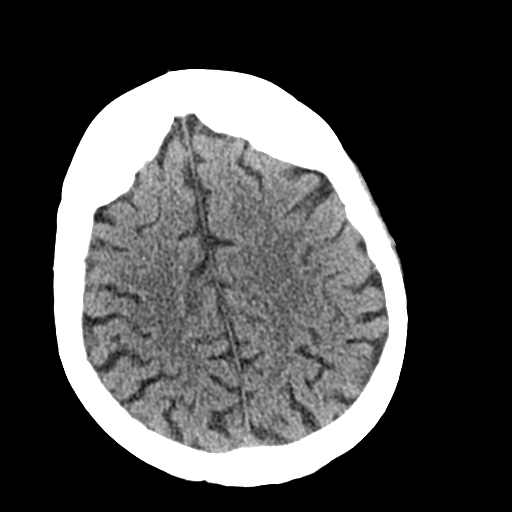
[im 24/32  brain]
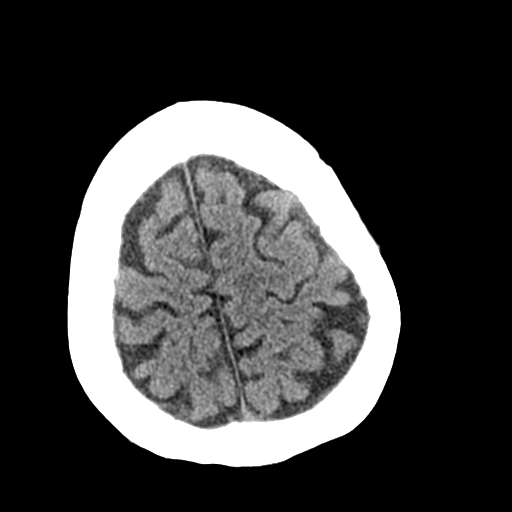
[im 26/32  brain]
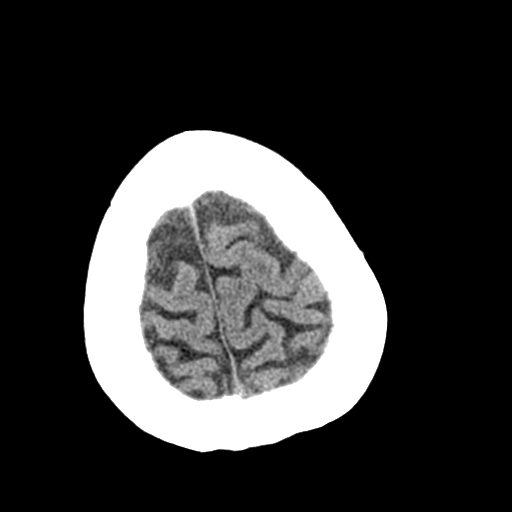
[im 26/32  bone]
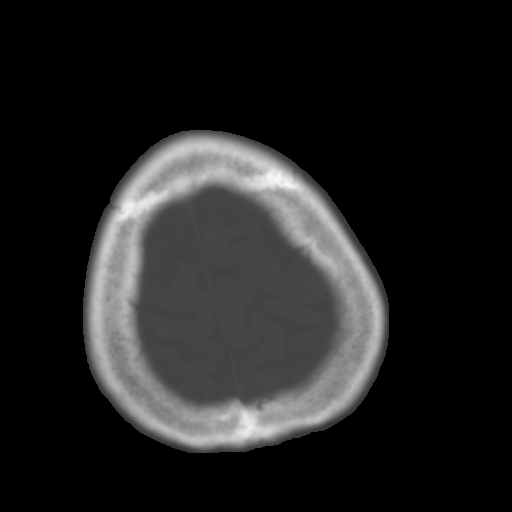
[im 29/32  brain]
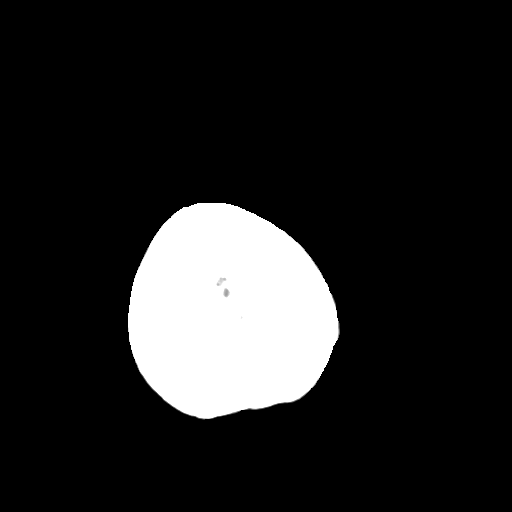

[Series 5: coronal soft tissue · coronal · 0.31mm/px · 3 of 65 slices shown]
[im 22/65  brain]
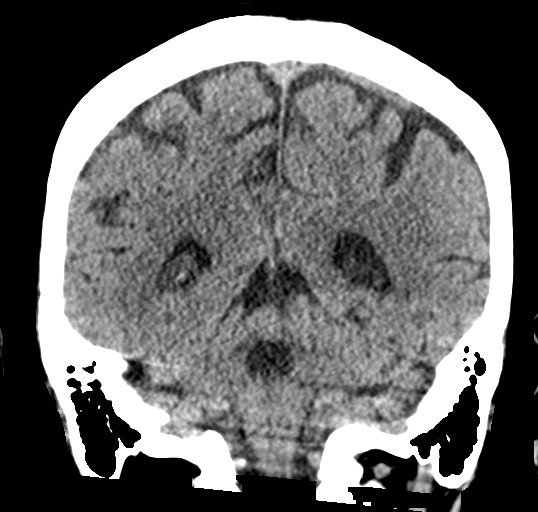
[im 29/65  brain]
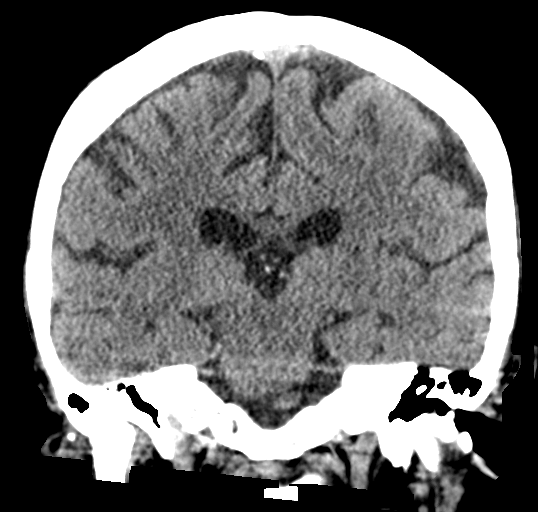
[im 36/65  brain]
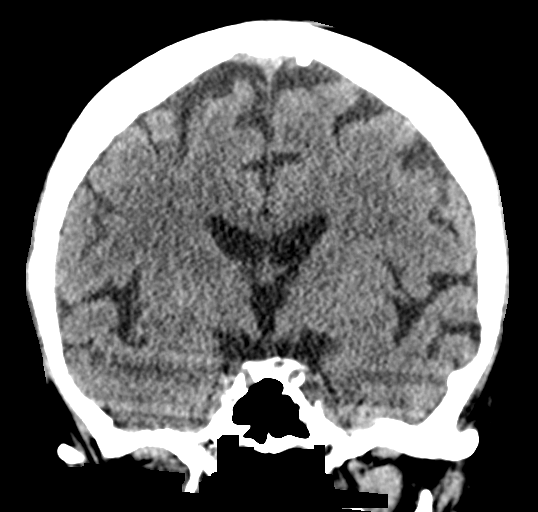

[Series 6: sagittal soft tissue · sagittal · 0.31mm/px · 3 of 51 slices shown]
[im 17/51  brain]
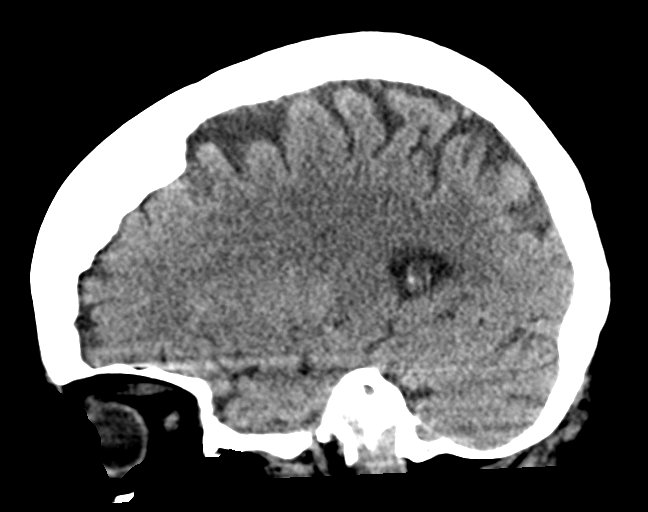
[im 26/51  brain]
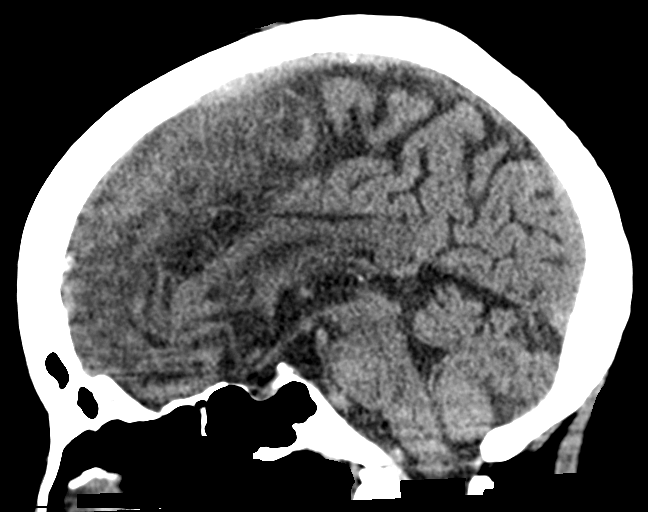
[im 34/51  brain]
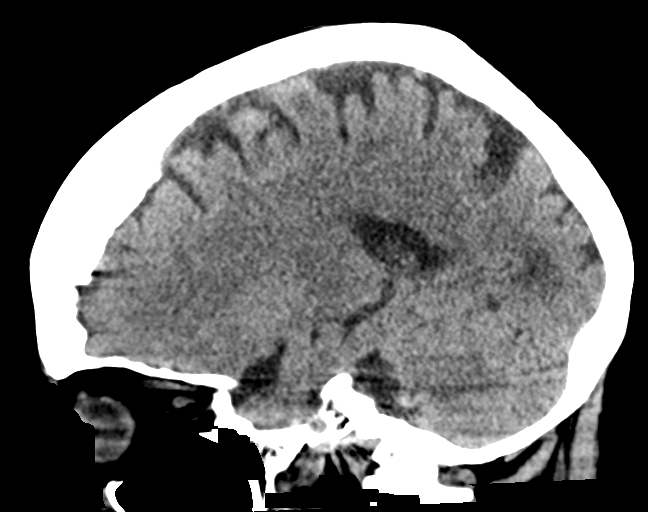

[16 of 47 positions shown; findings below may reference images not displayed]

FINDINGS: Brain: Atrophic changes and chronic white matter ischemic change is
seen. No findings to suggest acute hemorrhage, acute infarction or
space-occupying mass lesion are noted.

Vascular: No hyperdense vessel or unexpected calcification.

Skull: Normal. Negative for fracture or focal lesion.

Sinuses/Orbits: No acute finding.

Other: None.
IMPRESSION: Chronic atrophic and ischemic changes without acute abnormality.

## 2020-03-17 IMAGING — CR DG CHEST 2V
2 series · 2 of 2 positions shown · non-contrast
Comparison: February 10, 2014

CLINICAL DATA: Confusion

EXAM:
CHEST - 2 VIEW

[w chest lat]
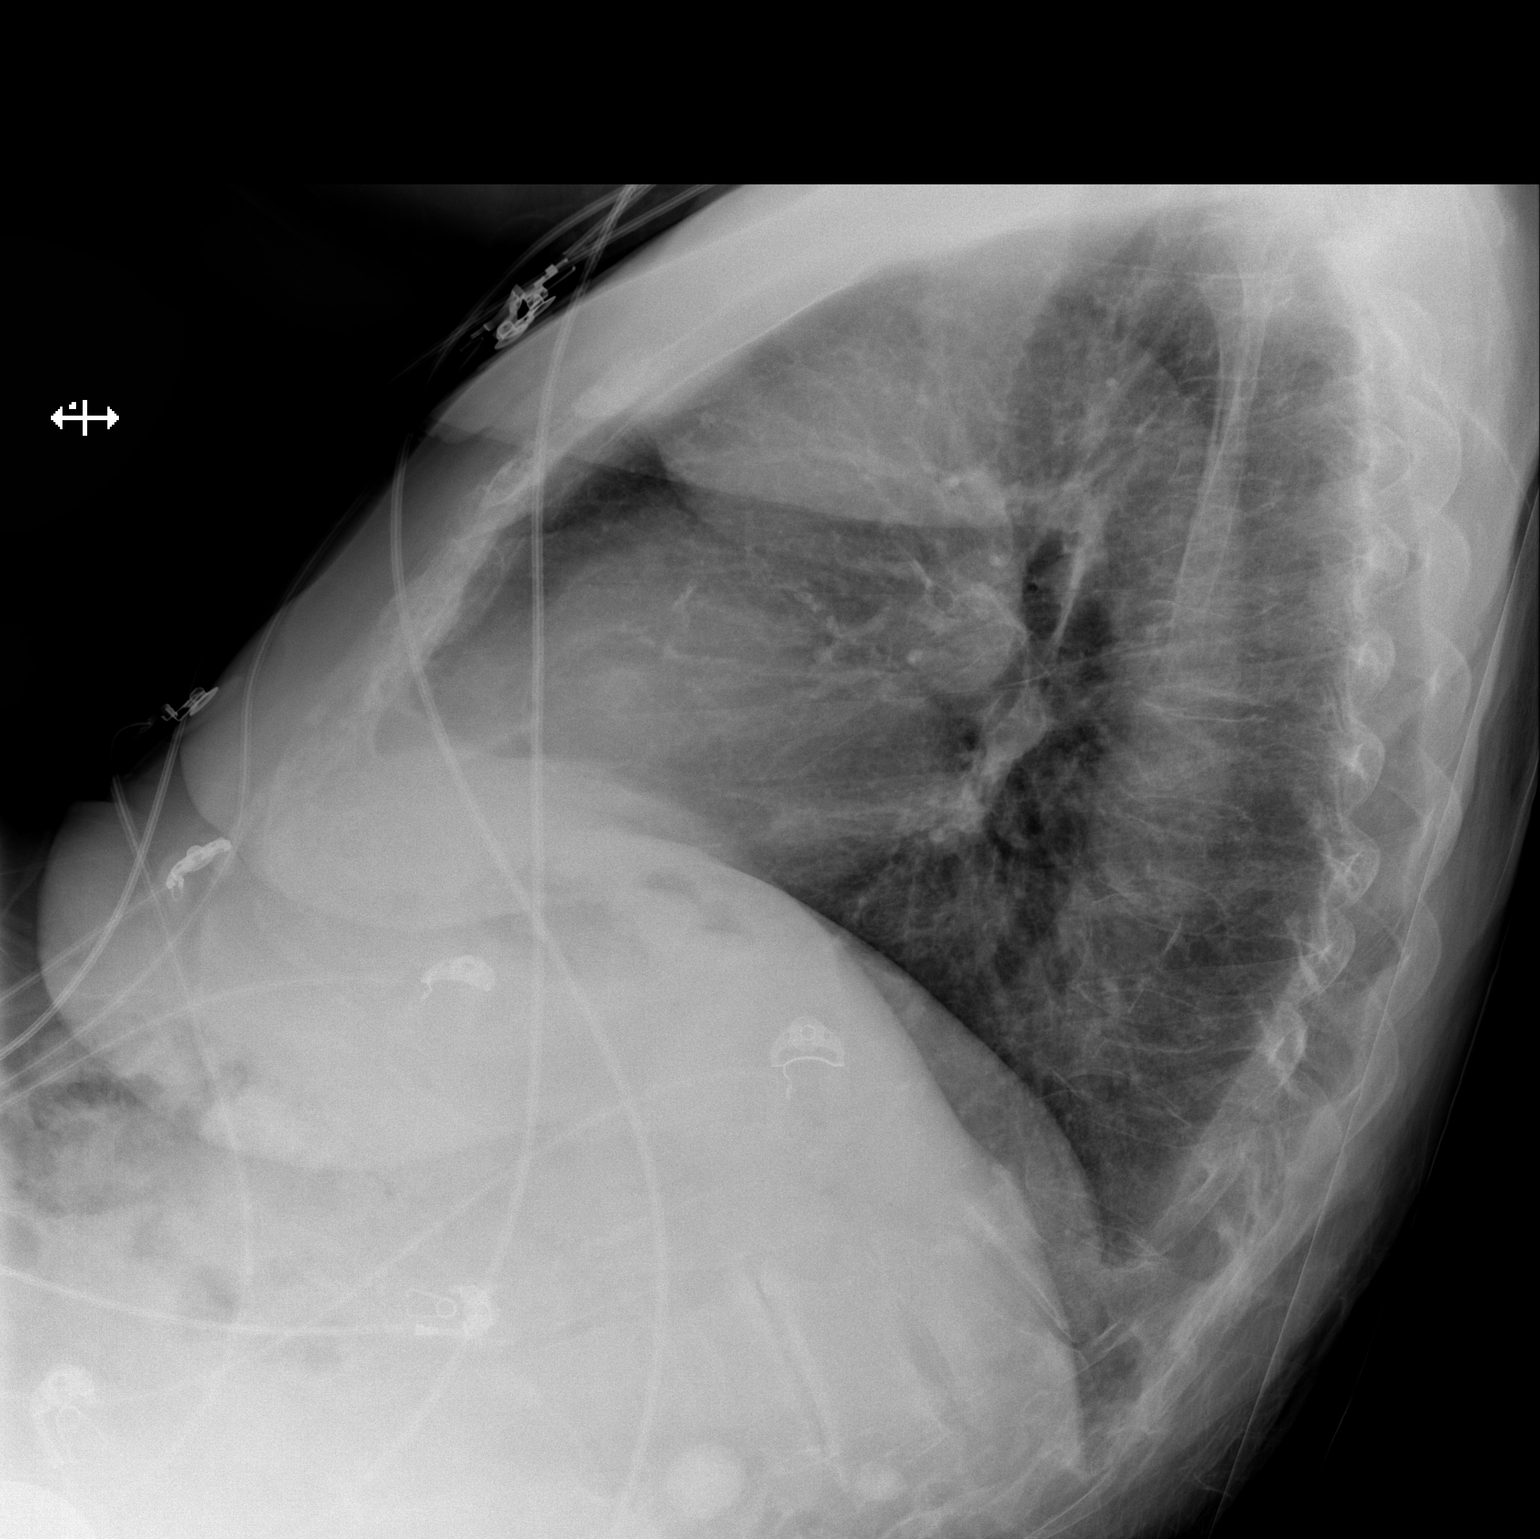

[x chest ap]
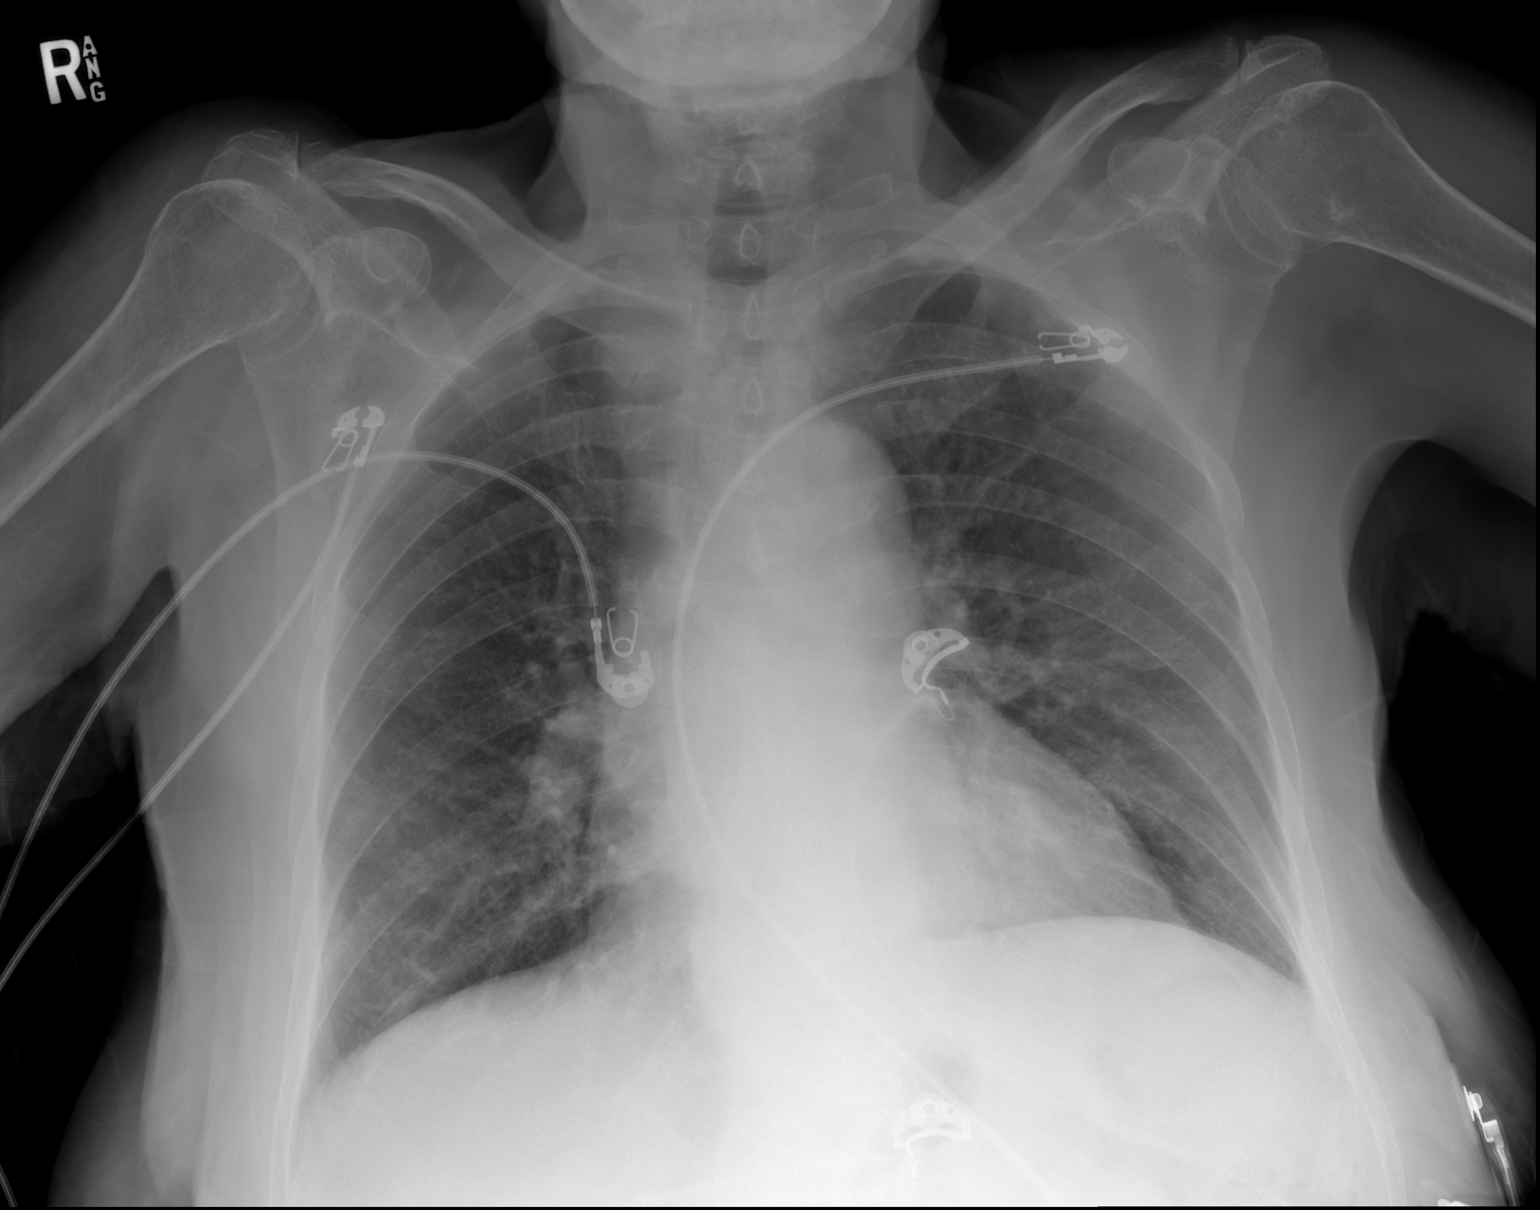

[2 of 2 positions shown; findings below may reference images not displayed]

FINDINGS: There is no edema or consolidation. The heart size and pulmonary
vascularity are normal. No adenopathy. No evident bone lesions.
There is aortic atherosclerosis.
IMPRESSION: No edema or consolidation.  There is aortic atherosclerosis.

Aortic Atherosclerosis ([id number]-XXX.X).
